# Patient Record
Sex: Female | Born: 1995 | Race: White | Hispanic: No | Marital: Single | Smoking: Current every day smoker
Health system: Southern US, Community
[De-identification: ages and names within clinical notes are randomized; demographics above are authoritative.]

## PROBLEM LIST (undated history)

## (undated) ENCOUNTER — Inpatient Hospital Stay (HOSPITAL_COMMUNITY): Payer: Self-pay

## (undated) DIAGNOSIS — Z8619 Personal history of other infectious and parasitic diseases: Secondary | ICD-10-CM

## (undated) DIAGNOSIS — Z789 Other specified health status: Secondary | ICD-10-CM

## (undated) HISTORY — PX: MOUTH SURGERY: SHX715

## (undated) HISTORY — DX: Personal history of other infectious and parasitic diseases: Z86.19

## (undated) HISTORY — PX: OTHER SURGICAL HISTORY: SHX169

---
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
## 2013-06-26 ENCOUNTER — Inpatient Hospital Stay (HOSPITAL_COMMUNITY)
Admission: AD | Admit: 2013-06-26 | Discharge: 2013-06-26 | Disposition: A | Discharge status: Home / Self Care | Payer: Medicaid Other | Source: Ambulatory Visit | Attending: Obstetrics & Gynecology | Admitting: Obstetrics & Gynecology

## 2013-06-26 ENCOUNTER — Encounter (HOSPITAL_COMMUNITY): Payer: Self-pay | Admitting: *Deleted

## 2013-06-26 DIAGNOSIS — F17200 Nicotine dependence, unspecified, uncomplicated: Secondary | ICD-10-CM | POA: Insufficient documentation

## 2013-06-26 DIAGNOSIS — R112 Nausea with vomiting, unspecified: Secondary | ICD-10-CM | POA: Insufficient documentation

## 2013-06-26 DIAGNOSIS — Z3202 Encounter for pregnancy test, result negative: Secondary | ICD-10-CM

## 2013-06-26 DIAGNOSIS — R109 Unspecified abdominal pain: Secondary | ICD-10-CM | POA: Insufficient documentation

## 2013-06-26 HISTORY — DX: Other specified health status: Z78.9

## 2013-06-26 LAB — URINALYSIS, ROUTINE W REFLEX MICROSCOPIC
Bilirubin Urine: NEGATIVE
GLUCOSE, UA: NEGATIVE mg/dL
Ketones, ur: NEGATIVE mg/dL
LEUKOCYTES UA: NEGATIVE
Nitrite: NEGATIVE
PROTEIN: NEGATIVE mg/dL
SPECIFIC GRAVITY, URINE: 1.02 (ref 1.005–1.030)
Urobilinogen, UA: 0.2 mg/dL (ref 0.0–1.0)
pH: 6 (ref 5.0–8.0)

## 2013-06-26 LAB — URINE MICROSCOPIC-ADD ON

## 2013-06-26 LAB — HCG, QUANTITATIVE, PREGNANCY

## 2013-06-26 LAB — POCT PREGNANCY, URINE: PREG TEST UR: NEGATIVE

## 2013-06-26 NOTE — MAU Provider Note (Signed)
History     CSN: [id number]  Arrival date and time: 06/26/13 1857   None     Chief Complaint  Patient presents with  . Abdominal Pain  . Possible Pregnancy   HPI Rose Gibbs is 18 y.o. G1P0 presents for pregnancy test.  Her menstrual cycle is late and she has had some nausea. LMP 05/20/13.  She is sexually active, not using contraception.  She does not want pregnancy at this time.  She reported white discharge in Triage.  States it is normal and there is no odor or abnormal color.  She does report nausea and vomiting several times last night and once today.     Past Medical History  Diagnosis Date  . Medical history non-contributory     Past Surgical History  Procedure Laterality Date  . Mouth surgery      No family history on file.  History  Substance Use Topics  . Smoking status: Current Every Day Smoker -- 0.50 packs/day    Types: Cigarettes  . Smokeless tobacco: Not on file  . Alcohol Use: No    Allergies: No Known Allergies  Prescriptions prior to admission  Medication Sig Dispense Refill  . acyclovir (ZOVIRAX) 400 MG tablet Take 1 tablet (400 mg total) by mouth every 4 (four) hours while awake.  35 tablet  0  . amoxicillin-clavulanate (AUGMENTIN) 875-125 MG per tablet Take 1 tablet by mouth 2 (two) times daily.  20 tablet  0  . mupirocin ointment (BACTROBAN) 2 % Apply topically 3 (three) times daily.  22 g  0  . traMADol (ULTRAM) 50 MG tablet Take 2 tablets (100 mg total) by mouth every 8 (eight) hours as needed for pain.  30 tablet  0    Review of Systems  Gastrointestinal: Positive for nausea and vomiting. Negative for abdominal pain.  Genitourinary:       Neg for vaginal bleeding or abnormal discharge   Physical Exam   Blood pressure 132/80, pulse 98, resp. rate 18, height 5\' 3"  (1.6 m), weight 111 lb 3.2 oz (50.44 kg), last menstrual period 05/20/2013.  Physical Exam  Constitutional: She is oriented to person, place, and time. She appears  well-developed and well-nourished. No distress.  HENT:  Head: Normocephalic.  Cardiovascular: Normal rate.   Respiratory: Effort normal.  Genitourinary:  Pelvic exam declined by patient  Neurological: She is alert and oriented to person, place, and time.  Skin: Skin is warm and dry. No erythema.  Psychiatric: She has a normal mood and affect. Her behavior is normal.    MAU Course  Procedures  MDM Care turned over to MizeJulie, [place] at 20:00. KEY,EVE M 06/26/2013, 7:49 PM   2000 - Labs pending. Care assumed from Jeani SowEve Key, NP  Results for orders placed during the hospital encounter of 06/26/13 (from the past 24 hour(s))  URINALYSIS, ROUTINE W REFLEX MICROSCOPIC     Status: Abnormal   Collection Time    06/26/13  7:05 PM      Result Value Ref Range   Color, Urine YELLOW  YELLOW   APPearance CLEAR  CLEAR   Specific Gravity, Urine 1.020  1.005 - 1.030   pH 6.0  5.0 - 8.0   Glucose, UA NEGATIVE  NEGATIVE mg/dL   Hgb urine dipstick MODERATE (*) NEGATIVE   Bilirubin Urine NEGATIVE  NEGATIVE   Ketones, ur NEGATIVE  NEGATIVE mg/dL   Protein, ur NEGATIVE  NEGATIVE mg/dL   Urobilinogen, UA 0.2  0.0 - 1.0 mg/dL  Nitrite NEGATIVE  NEGATIVE   Leukocytes, UA NEGATIVE  NEGATIVE  URINE MICROSCOPIC-ADD ON     Status: Abnormal   Collection Time    06/26/13  7:05 PM      Result Value Ref Range   Squamous Epithelial / LPF FEW (*) RARE   WBC, UA 0-2  <3 WBC/hpf   RBC / HPF 0-2  <3 RBC/hpf   Bacteria, UA RARE  RARE  POCT PREGNANCY, URINE     Status: None   Collection Time    06/26/13  7:16 PM      Result Value Ref Range   Preg Test, Ur NEGATIVE  NEGATIVE  HCG, QUANTITATIVE, PREGNANCY     Status: None   Collection Time    06/26/13  7:58 PM      Result Value Ref Range   hCG, Beta Chain, Quant, S <1  <5 mIU/mL    Assessment and Plan  A: Negative pregnancy test  P: Discharge home Patient advised to take HPT in 1-2 weeks if still no period and follow-up accordingly Patient may return to  MAU as needed or if her condition were to change or worsen  Freddi StarrJulie N Ethier, PA-C  06/26/2013 8:59 PM

## 2013-06-26 NOTE — Discharge Instructions (Signed)
Pregnancy Tests HOW DO PREGNANCY TESTS WORK? All pregnancy tests look for a special hormone in the urine or blood that is only present in pregnant women. This hormone, human chorionic gonadotropin (hCG), is also called the pregnancy hormone.  WHAT IS THE DIFFERENCE BETWEEN A URINE AND A BLOOD PREGNANCY TEST? IS ONE BETTER THAN THE OTHER? There are two types of pregnancy tests.  Blood tests.  Urine tests. Both tests look for the presence of hCG, the pregnancy hormone. Many women use a urine test or home pregnancy test (HPT) to find out if they are pregnant. HPTs are cheap, easy to use, can be done at home, and are private. When a woman has a positive result on an HPT, she needs to see her caregiver right away. The caregiver can confirm a positive HPT result with another urine test, a blood test, ultrasound, and a pelvic exam.  There are two types of blood tests you can get from a caregiver.   A quantitative blood test (or the beta hCG test). This test measures the exact amount of hCG in the blood. This means it can pick up very small amounts of hCG, making it a very accurate test.  A qualitative hCG blood test. This test gives a simple yes or no answer to whether you are pregnant. This test is more like a urine test in terms of its accuracy. Blood tests can pick up hCG earlier in a pregnancy than urine tests can. Blood tests can tell if you are pregnant about 6 to 8 days after you release an egg from an ovary (ovulate). Urine tests can determine pregnancy about 2 weeks after ovulation.  HOW IS A HOME PREGNANCY TEST DONE?  There are many types of home pregnancy tests or HPTs that can be bought over-the-counter at drug or discount stores.   Some involve collecting your urine in a cup and dipping a stick into the urine or putting some of the urine into a special container with an eyedropper.  Others are done by placing a stick into your urine stream.  Tests vary in how long you need to wait for  the stick or container to turn a certain color or have a symbol on it (like a plus or a minus).  All tests come with written instructions. Most tests also have toll-free phone numbers to call if you have any questions about how to do the test or read the results. HOW ACCURATE ARE HOME PREGNANCY TESTS?  HPTs are very accurate. Most brands of HPTs say they are 97% to 99% accurate when taken 1 week after missing your menstrual period, but this can vary with actual use. Each brand varies in how sensitive it is in picking up the pregnancy hormone hCG. If a test is not done correctly, it will be less accurate. Always check the package to make sure it is not past its expiration date. If it is, it will not be accurate. Most brands of HPTs tell users to do the test again in a few days, no matter what the results.  If you use an HPT too early in your pregnancy, you may not have enough of the pregnancy hormone hCG in your urine to have a positive test result. Most HPTs will be accurate if you test yourself around the time your period is due (about 2 weeks after you ovulate). You can get a negative test result if you are not pregnant or if you ovulated later than you thought you did.  You may also have problems with the pregnancy, which affects the amount of hCG you have in your urine. If your HPT is negative, test yourself again within a few days to 1 week. If you keep getting a negative result and think you are pregnant, talk with your caregiver right away about getting a blood pregnancy test.  FALSE POSITIVE PREGNANCY TEST A false positive HPT can happen if there is blood or protein present in your urine. A false positive can also happen if you were recently pregnant or if you take a pregnancy test too soon after taking fertility drug that contains hCG. Also, some prescription medicines such as water pills (diuretics), tranquilizers, seizure medicines, psychiatric medicines, and allergy and nausea medicines  (promethazine) give false positive readings. FALSE NEGATIVE PREGNANCY TEST  A false negative HPT can happen if you do the test too early. Try to wait until you are at least 1 day late for your menstrual period.  It may happen if you wait too long to test the urine (longer than 15 minutes).  It may also happen if the urine is too diluted because you drank a lot of fluids before getting the urine sample. It is best to test the first morning urine after you get out of bed. If your menstrual period did not start after a week of a negative HPT, repeat the pregnancy test. CAN ANYTHING INTERFERE WITH HOME PREGNANCY TEST RESULTS?  Most medicines, both over-the-counter and prescription drugs, including birth control pills and antibiotics, should not affect the results of a HPT. Only those drugs that have the pregnancy hormone hCG in them can give a false positive test result. Drugs that have hCG in them may be used for treating infertility (not being able to get pregnant). Alcohol and illegal drugs do not affect HPT results, but you should not be using these substances if you are trying to get pregnant. If you have a positive pregnancy test, call your caregiver to make an appointment to begin prenatal care. Document Released: 12/28/2002 Document Revised: 03/19/2011 Document Reviewed: 03/10/2010 [facility] Patient Information 2015 [place], [place]. This information is not intended to replace advice given to you by your health care provider. Make sure you discuss any questions you have with your health care provider.

## 2013-06-26 NOTE — MAU Note (Signed)
My period is late and I've thrown up some. Having some pain in lower abd that goes from side to side. Having some white d/c

## 2013-11-09 ENCOUNTER — Encounter (HOSPITAL_COMMUNITY): Payer: Self-pay | Admitting: *Deleted

## 2014-01-08 NOTE — L&D Delivery Note (Signed)
Patient was C/C/+3 and pushed for 10 minutes with epidural.   NSVD  female infant, Apgars 8,9, weight P.   The patient had one first degree perineal and one R first degree labial laceration repaired with 2-0 and 3-0 vicryl R respectively. Fundus was firm. EBL was expected amount- my estimate about 200cc. Placenta was delivered intact. Vagina was clear.  Baby was vigorous and doing skin to skin with mother.  Mita Vallo A

## 2014-02-19 ENCOUNTER — Emergency Department (INDEPENDENT_AMBULATORY_CARE_PROVIDER_SITE_OTHER)
Admission: EM | Admit: 2014-02-19 | Discharge: 2014-02-19 | Disposition: A | Discharge status: Home / Self Care | Payer: Self-pay | Source: Home / Self Care | Attending: Emergency Medicine | Admitting: Emergency Medicine

## 2014-02-19 ENCOUNTER — Encounter (HOSPITAL_COMMUNITY): Payer: Self-pay | Admitting: Emergency Medicine

## 2014-02-19 DIAGNOSIS — Z3201 Encounter for pregnancy test, result positive: Secondary | ICD-10-CM

## 2014-02-19 LAB — POCT URINALYSIS DIP (DEVICE)
Glucose, UA: NEGATIVE mg/dL
KETONES UR: NEGATIVE mg/dL
LEUKOCYTES UA: NEGATIVE
Nitrite: NEGATIVE
Protein, ur: NEGATIVE mg/dL
Urobilinogen, UA: 0.2 mg/dL (ref 0.0–1.0)
pH: 6 (ref 5.0–8.0)

## 2014-02-19 LAB — POCT PREGNANCY, URINE: Preg Test, Ur: POSITIVE — AB

## 2014-02-19 MED ORDER — PRENATAL VITAMIN 27-0.8 MG PO TABS: 1.0000 | ORAL_TABLET | Freq: Every day | ORAL | Status: DC

## 2014-02-19 NOTE — ED Provider Notes (Signed)
CSN: [id number]     Arrival date & time 02/19/14  1140 History   First MD Initiated Contact with Patient 02/19/14 1205     Chief Complaint  Patient presents with  . Possible Pregnancy   (Consider location/radiation/quality/duration/timing/severity/associated sxs/prior Treatment) HPI Comments: 19 year old female has had 2 positive home pregnancy test. Her LMP was 12/29/2013. She is experiencing breast tenderness and a.m. nausea with occasional vomiting. She denies pelvic pain, abdominal pain, vaginal discharge or bleeding.   Past Medical History  Diagnosis Date  . Medical history non-contributory    Past Surgical History  Procedure Laterality Date  . Mouth surgery     History reviewed. No pertinent family history. History  Substance Use Topics  . Smoking status: Current Every Day Smoker -- 0.50 packs/day    Types: Cigarettes  . Smokeless tobacco: Not on file  . Alcohol Use: No   OB History    Gravida Para Term Preterm AB TAB SAB Ectopic Multiple Living   1              Review of Systems  Constitutional: Negative.   Cardiovascular: Negative for chest pain.  Gastrointestinal: Positive for nausea. Negative for abdominal pain and abdominal distention.  Genitourinary: Positive for frequency. Negative for dysuria, urgency, vaginal bleeding, vaginal discharge and pelvic pain.    Allergies  Amoxicillin; Penicillins; and Latex  Home Medications   Prior to Admission medications   Medication Sig Start Date End Date Taking? Authorizing Provider  acetaminophen (TYLENOL) 325 MG tablet Take 650 mg by mouth every 6 (six) hours as needed for headache.    Historical Provider, MD  Naphazoline-Glycerin (CLEAR EYES MAX REDNESS RELIEF) 0.03-0.5 % SOLN Place 1 drop into both eyes daily as needed (For redness.).    Historical Provider, MD  Prenatal Vit-Fe Fumarate-FA (PRENATAL VITAMIN) 27-0.8 MG TABS Take 1 tablet by mouth daily. 02/19/14   Hayden Rasmussenavid Monae Topping, NP   BP 114/70 mmHg  Pulse 80   Temp(Src) 97.9 F (36.6 C) (Oral)  Resp 18  SpO2 97%  LMP 12/29/2013 (Exact Date) Physical Exam  Constitutional: She is oriented to person, place, and time. She appears well-developed and well-nourished. No distress.  Neck: Normal range of motion. Neck supple.  Pulmonary/Chest: Effort normal. No respiratory distress.  Abdominal: Soft. She exhibits no distension and no mass. There is no tenderness. There is no rebound and no guarding.  No tenderness across the anterior pelvis  Neurological: She is alert and oriented to person, place, and time. She exhibits normal muscle tone.  Skin: Skin is warm and dry.  Psychiatric: She has a normal mood and affect.  Nursing note and vitals reviewed.   ED Course  Procedures (including critical care time) Labs Review Labs Reviewed  POCT PREGNANCY, URINE - Abnormal; Notable for the following:    Preg Test, Ur POSITIVE (*)    All other components within normal limits  POCT URINALYSIS DIP (DEVICE) - Abnormal; Notable for the following:    Bilirubin Urine SMALL (*)    Hgb urine dipstick MODERATE (*)    All other components within normal limits    Imaging Review No results found.   MDM   1. Positive urine pregnancy test    Rx for prenatal vitamins written Note stating patient's test for pregnancy is positive Follow-up with PCP and eventually OB/GYN. There is moderate amount of blood in the urine. If he develops urine symptoms such as pain with urination, increased frequency, fever, pelvic pain or vaginal bleeding [facility].    Hayden Rasmussen, NP 02/19/14 1226

## 2014-02-19 NOTE — ED Notes (Signed)
Pt is here for a pregnancy test.

## 2014-02-19 NOTE — Discharge Instructions (Signed)
Pregnancy Tests HOW DO PREGNANCY TESTS WORK? All pregnancy tests look for a special hormone in the urine or blood that is only present in pregnant women. This hormone, human chorionic gonadotropin (hCG), is also called the pregnancy hormone.  WHAT IS THE DIFFERENCE BETWEEN A URINE AND A BLOOD PREGNANCY TEST? IS ONE BETTER THAN THE OTHER? There are two types of pregnancy tests.  Blood tests.  Urine tests. Both tests look for the presence of hCG, the pregnancy hormone. Many women use a urine test or home pregnancy test (HPT) to find out if they are pregnant. HPTs are cheap, easy to use, can be done at home, and are private. When a woman has a positive result on an HPT, she needs to see her caregiver right away. The caregiver can confirm a positive HPT result with another urine test, a blood test, ultrasound, and a pelvic exam.  There are two types of blood tests you can get from a caregiver.   A quantitative blood test (or the beta hCG test). This test measures the exact amount of hCG in the blood. This means it can pick up very small amounts of hCG, making it a very accurate test.  A qualitative hCG blood test. This test gives a simple yes or no answer to whether you are pregnant. This test is more like a urine test in terms of its accuracy. Blood tests can pick up hCG earlier in a pregnancy than urine tests can. Blood tests can tell if you are pregnant about 6 to 8 days after you release an egg from an ovary (ovulate). Urine tests can determine pregnancy about 2 weeks after ovulation.  HOW IS A HOME PREGNANCY TEST DONE?  There are many types of home pregnancy tests or HPTs that can be bought over-the-counter at drug or discount stores.   Some involve collecting your urine in a cup and dipping a stick into the urine or putting some of the urine into a special container with an eyedropper.  Others are done by placing a stick into your urine stream.  Tests vary in how long you need to wait for  the stick or container to turn a certain color or have a symbol on it (like a plus or a minus).  All tests come with written instructions. Most tests also have toll-free phone numbers to call if you have any questions about how to do the test or read the results. HOW ACCURATE ARE HOME PREGNANCY TESTS?  HPTs are very accurate. Most brands of HPTs say they are 97% to 99% accurate when taken 1 week after missing your menstrual period, but this can vary with actual use. Each brand varies in how sensitive it is in picking up the pregnancy hormone hCG. If a test is not done correctly, it will be less accurate. Always check the package to make sure it is not past its expiration date. If it is, it will not be accurate. Most brands of HPTs tell users to do the test again in a few days, no matter what the results.  If you use an HPT too early in your pregnancy, you may not have enough of the pregnancy hormone hCG in your urine to have a positive test result. Most HPTs will be accurate if you test yourself around the time your period is due (about 2 weeks after you ovulate). You can get a negative test result if you are not pregnant or if you ovulated later than you thought you did.  You may also have problems with the pregnancy, which affects the amount of hCG you have in your urine. If your HPT is negative, test yourself again within a few days to 1 week. If you keep getting a negative result and think you are pregnant, talk with your caregiver right away about getting a blood pregnancy test.  FALSE POSITIVE PREGNANCY TEST A false positive HPT can happen if there is blood or protein present in your urine. A false positive can also happen if you were recently pregnant or if you take a pregnancy test too soon after taking fertility drug that contains hCG. Also, some prescription medicines such as water pills (diuretics), tranquilizers, seizure medicines, psychiatric medicines, and allergy and nausea medicines  (promethazine) give false positive readings. FALSE NEGATIVE PREGNANCY TEST  A false negative HPT can happen if you do the test too early. Try to wait until you are at least 1 day late for your menstrual period.  It may happen if you wait too long to test the urine (longer than 15 minutes).  It may also happen if the urine is too diluted because you drank a lot of fluids before getting the urine sample. It is best to test the first morning urine after you get out of bed. If your menstrual period did not start after a week of a negative HPT, repeat the pregnancy test. CAN ANYTHING INTERFERE WITH HOME PREGNANCY TEST RESULTS?  Most medicines, both over-the-counter and prescription drugs, including birth control pills and antibiotics, should not affect the results of a HPT. Only those drugs that have the pregnancy hormone hCG in them can give a false positive test result. Drugs that have hCG in them may be used for treating infertility (not being able to get pregnant). Alcohol and illegal drugs do not affect HPT results, but you should not be using these substances if you are trying to get pregnant. If you have a positive pregnancy test, call your caregiver to make an appointment to begin prenatal care. Document Released: 12/28/2002 Document Revised: 03/19/2011 Document Reviewed: 04/10/2013 [facility]Exit[facility] Patient Information 2015 [place], [place]. This information is not intended to replace advice given to you by your health care provider. Make sure you discuss any questions you have with your health care provider.  Prenatal Care  WHAT IS PRENATAL CARE?  Prenatal care means health care during your pregnancy, before your baby is born. It is very important to take care of yourself and your baby during your pregnancy by:   Getting early prenatal care. If you know you are pregnant, or think you might be pregnant, call your health care provider as soon as possible. Schedule a visit for a prenatal exam.  Getting  regular prenatal care. Follow your health care provider's schedule for blood and other necessary tests. Do not miss appointments.  Doing everything you can to keep yourself and your baby healthy during your pregnancy.  Getting complete care. Prenatal care should include evaluation of the medical, dietary, educational, psychological, and social needs of you and your significant other. The medical and genetic history of your family and the family of your baby's father should be discussed with your health care provider.  Discussing with your health care provider:  Prescription, over-the-counter, and herbal medicines that you take.  Any history of substance abuse, alcohol use, smoking, and illegal drug use.  Any history of domestic abuse and violence.  Immunizations you have received.  Your nutrition and diet.  The amount of exercise you do.  Any environmental and occupational hazards to which you are exposed.  History of sexually transmitted infections for both you and your partner.  Previous pregnancies you have had. WHY IS PRENATAL CARE SO IMPORTANT?  By regularly seeing your health care provider, you help ensure that problems can be identified early so that they can be treated as soon as possible. Other problems might be prevented. Many studies have shown that early and regular prenatal care is important for the health of mothers and their babies.  HOW CAN I TAKE CARE OF MYSELF WHILE I AM PREGNANT?  Here are ways to take care of yourself and your baby:   Start or continue taking your multivitamin with 400 micrograms (mcg) of folic acid every day.  Get early and regular prenatal care. It is very important to see a health care provider during your pregnancy. Your health care provider will check at each visit to make sure that you and your baby are healthy. If there are any problems, action can be taken right away to help you and your baby.  Eat a healthy diet that  includes:  Fruits.  Vegetables.  Foods low in saturated fat.  Whole grains.  Calcium-rich foods, such as milk, yogurt, and hard cheeses.  Drink 6-8 glasses of liquids a day.  Unless your health care provider tells you not to, try to be physically active for 30 minutes, most days of the week. If you are pressed for time, you can get your activity in through 10-minute segments, three times a day.  Do not smoke, drink alcohol, or use drugs. These can cause long-term damage to your baby. Talk with your health care provider about steps to take to stop smoking. Talk with a member of your faith community, a counselor, a trusted friend, or your health care provider if you are concerned about your alcohol or drug use.  Ask your health care provider before taking any medicine, even over-the-counter medicines. Some medicines are not safe to take during pregnancy.  Get plenty of rest and sleep.  Avoid hot tubs and saunas during pregnancy.  Do not have X-rays taken unless absolutely necessary and with the recommendation of your health care provider. A lead shield can be placed on your abdomen to protect your baby when X-rays are taken in other parts of your body.  Do not empty the cat litter when you are pregnant. It may contain a parasite that causes an infection called toxoplasmosis, which can cause birth defects. Also, use gloves when working in garden areas used by cats.  Do not eat uncooked or undercooked meats or fish.  Do not eat soft, mold-ripened cheeses (Brie, Camembert, and chevre) or soft, blue-veined cheese (Danish blue and Roquefort).  Stay away from toxic chemicals like:  Insecticides.  Solvents (some cleaners or paint thinners).  Lead.  Mercury.  Sexual intercourse may continue until the end of the pregnancy, unless you have a medical problem or there is a problem with the pregnancy and your health care provider tells you not to.  Do not wear high-heel shoes, especially  during the second half of the pregnancy. You can lose your balance and fall.  Do not take long trips, unless absolutely necessary. Be sure to see your health care provider before going on the trip.  Do not sit in one position for more than 2 hours when on a trip.  Take a copy of your medical records when going on a trip. Know where a hospital is located  in the city you are visiting, in case of an emergency.  Most dangerous household products will have pregnancy warnings on their labels. Ask your health care provider about products if you are unsure.  Limit or eliminate your caffeine intake from coffee, tea, sodas, medicines, and chocolate.  Many women continue working through pregnancy. Staying active might help you stay healthier. If you have a question about the safety or the hours you work at your particular job, talk with your health care provider.  Get informed:  Read books.  Watch videos.  Go to childbirth classes for you and your significant other.  Talk with experienced moms.  Ask your health care provider about childbirth education classes for you and your partner. Classes can help you and your partner prepare for the birth of your baby.  Ask about a baby doctor (pediatrician) and methods and pain medicine for labor, delivery, and possible cesarean delivery. HOW OFTEN SHOULD I SEE MY HEALTH CARE PROVIDER DURING PREGNANCY?  Your health care provider will give you a schedule for your prenatal visits. You will have visits more often as you get closer to the end of your pregnancy. An average pregnancy lasts about 40 weeks.  A typical schedule includes visiting your health care provider:   About once each month during your first 6 months of pregnancy.  Every 2 weeks during the next 2 months.  Weekly in the last month, until the delivery date. Your health care provider will probably want to see you more often if:  You are older than 35 years.  Your pregnancy is high risk  because you have certain health problems or problems with the pregnancy, such as:  Diabetes.  High blood pressure.  The baby is not growing on schedule, according to the dates of the pregnancy. Your health care provider will do special tests to make sure you and your baby are not having any serious problems. WHAT HAPPENS DURING PRENATAL VISITS?   At your first prenatal visit, your health care provider will do a physical exam and talk to you about your health history and the health history of your partner and your family. Your health care provider will be able to tell you what date to expect your baby to be born on.  Your first physical exam will include checks of your blood pressure, measurements of your height and weight, and an exam of your pelvic organs. Your health care provider will do a Pap test if you have not had one recently and will do cultures of your cervix to make sure there is no infection.  At each prenatal visit, there will be tests of your blood, urine, blood pressure, weight, and the progress of the baby will be checked.  At your later prenatal visits, your health care provider will check how you are doing and how your baby is developing. You may have a number of tests done as your pregnancy progresses.  Ultrasound exams are often used to check on your baby's growth and health.  You may have more urine and blood tests, as well as special tests, if needed. These may include amniocentesis to examine fluid in the pregnancy sac, stress tests to check how the baby responds to contractions, or a biophysical profile to measure your baby's well-being. Your health care provider will explain the tests and why they are necessary.  You should be tested for high blood sugar (gestational diabetes) between the 24th and 28th weeks of your pregnancy.  You should discuss  with your health care provider your plans to breastfeed or bottle-feed your baby.  Each visit is also a chance for you to  learn about staying healthy during pregnancy and to ask questions. Document Released: 12/28/2002 Document Revised: 12/30/2012 Document Reviewed: 03/11/2013 [facility] Patient Information 2015 [place], [place]. This information is not intended to replace advice given to you by your health care provider. Make sure you discuss any questions you have with your health care provider.

## 2014-04-30 LAB — OB RESULTS CONSOLE HIV ANTIBODY (ROUTINE TESTING): HIV: NONREACTIVE

## 2014-04-30 LAB — OB RESULTS CONSOLE GC/CHLAMYDIA
Chlamydia: NEGATIVE
Gonorrhea: NEGATIVE

## 2014-04-30 LAB — OB RESULTS CONSOLE ABO/RH: RH TYPE: POSITIVE

## 2014-04-30 LAB — OB RESULTS CONSOLE RPR: RPR: NONREACTIVE

## 2014-04-30 LAB — OB RESULTS CONSOLE RUBELLA ANTIBODY, IGM: Rubella: IMMUNE

## 2014-04-30 LAB — OB RESULTS CONSOLE ANTIBODY SCREEN: Antibody Screen: NEGATIVE

## 2014-04-30 LAB — OB RESULTS CONSOLE HEPATITIS B SURFACE ANTIGEN: HEP B S AG: NEGATIVE

## 2014-08-20 ENCOUNTER — Encounter (HOSPITAL_COMMUNITY): Payer: Self-pay | Admitting: *Deleted

## 2014-08-20 ENCOUNTER — Emergency Department (HOSPITAL_COMMUNITY)
Admission: EM | Admit: 2014-08-20 | Discharge: 2014-08-21 | Disposition: A | Discharge status: Home / Self Care | Payer: Medicaid Other | Attending: Emergency Medicine | Admitting: Emergency Medicine

## 2014-08-20 DIAGNOSIS — Z79899 Other long term (current) drug therapy: Secondary | ICD-10-CM | POA: Insufficient documentation

## 2014-08-20 DIAGNOSIS — Z3493 Encounter for supervision of normal pregnancy, unspecified, third trimester: Secondary | ICD-10-CM

## 2014-08-20 DIAGNOSIS — Y9289 Other specified places as the place of occurrence of the external cause: Secondary | ICD-10-CM | POA: Diagnosis not present

## 2014-08-20 DIAGNOSIS — W1839XA Other fall on same level, initial encounter: Secondary | ICD-10-CM | POA: Insufficient documentation

## 2014-08-20 DIAGNOSIS — S3991XA Unspecified injury of abdomen, initial encounter: Secondary | ICD-10-CM | POA: Insufficient documentation

## 2014-08-20 DIAGNOSIS — Z3A33 33 weeks gestation of pregnancy: Secondary | ICD-10-CM | POA: Diagnosis not present

## 2014-08-20 DIAGNOSIS — Y9389 Activity, other specified: Secondary | ICD-10-CM | POA: Diagnosis not present

## 2014-08-20 DIAGNOSIS — S3992XA Unspecified injury of lower back, initial encounter: Secondary | ICD-10-CM | POA: Insufficient documentation

## 2014-08-20 DIAGNOSIS — Y998 Other external cause status: Secondary | ICD-10-CM | POA: Diagnosis not present

## 2014-08-20 DIAGNOSIS — W19XXXA Unspecified fall, initial encounter: Secondary | ICD-10-CM

## 2014-08-20 DIAGNOSIS — O9A219 Injury, poisoning and certain other consequences of external causes complicating pregnancy, unspecified trimester: Secondary | ICD-10-CM | POA: Insufficient documentation

## 2014-08-20 MED ORDER — SODIUM CHLORIDE 0.9 % IV SOLN
1000.0000 mL | Freq: Once | INTRAVENOUS | Status: AC
  Administered 2014-08-20: 1000 mL via INTRAVENOUS

## 2014-08-20 MED ORDER — NIFEDIPINE 10 MG PO CAPS
10.0000 mg | ORAL_CAPSULE | Freq: Once | ORAL | Status: AC
  Administered 2014-08-20: 10 mg via ORAL
  Filled 2014-08-20: qty 1

## 2014-08-20 NOTE — Progress Notes (Signed)
No irritability over last 30 minutes. Patients states tail bone is sore from fall but otherwise feels ok. Monitoring d/c'd. Pt to discharge from ED.

## 2014-08-20 NOTE — ED Provider Notes (Signed)
CSN: [id number]     Arrival date & time 08/20/14  2116 History   First MD Initiated Contact with Patient 08/20/14 2126     Chief Complaint  Patient presents with  . Abdominal Pain    [redacted] weeks pregnant     (Consider location/radiation/quality/duration/timing/severity/associated sxs/prior Treatment) The history is provided by the patient and the EMS personnel. No language interpreter was used.  Patient is a 19 year old female who is G1 P0 with past medical history of oral cyst removal who presents who presents today as a level II trauma status post fall in the shower. Patient relations washing her hair in her parent's bathroom when she stepped back and the reset upon the at the floor and she slipped with her peak left underneath her landing on her bottom. Patient is notably [redacted] weeks gestational age. She denies any complications up to this point. Currently she endorses having pain in her sacral region and also is having significant abdominal pain on the top portion of her fundus. She relates she has not had any vaginal bleeding, gushes of fluid, contractions, and is still feeling the baby move. She denies any loss of consciousness with this. She denies any numbness or tingling in any of her extremities. Patient obstetrical care is here in [place]. Patient denies any other worsening or alleviating factors. She was able to ambulate after the accident.  History reviewed. No pertinent past medical history. Past Surgical History  Procedure Laterality Date  . Oral cyst removal     History reviewed. No pertinent family history. Social History  Substance Use Topics  . Smoking status: Never Smoker   . Smokeless tobacco: Never Used  . Alcohol Use: No   OB History    Gravida Para Term Preterm AB TAB SAB Ectopic Multiple Living   1              Review of Systems  HENT: Negative for facial swelling and nosebleeds.   Respiratory: Negative for chest tightness and shortness of breath.    Cardiovascular: Negative for chest pain and leg swelling.  Gastrointestinal: Positive for abdominal pain (top of abdomen). Negative for nausea and vomiting.  Genitourinary: Negative for vaginal bleeding and vaginal pain.  Musculoskeletal: Positive for back pain (very low back, near buttock (tailbone)). Negative for myalgias, joint swelling, arthralgias, gait problem, neck pain and neck stiffness.  Skin: Negative for rash and wound.  Neurological: Negative for weakness, light-headedness, numbness and headaches.  All other systems reviewed and are negative.     Allergies  Review of patient's allergies indicates no known allergies.  Home Medications   Prior to Admission medications   Medication Sig Start Date End Date Taking? Authorizing Provider  Prenatal Vit-Fe Fumarate-FA (PRENATAL MULTIVITAMIN) TABS tablet Take 1 tablet by mouth daily at 12 noon.   Yes Historical Provider, MD   BP 117/76 mmHg  Pulse 75  Temp(Src) 98.3 F (36.8 C) (Oral)  Resp 12  Ht 5\' 2"  (1.575 m)  Wt 140 lb (63.504 kg)  BMI 25.60 kg/m2  SpO2 97%  LMP 12/29/2013 Physical Exam  Constitutional: She is oriented to person, place, and time. She appears well-developed and well-nourished. No distress.  HENT:  Head: Normocephalic and atraumatic.  Eyes: Conjunctivae and EOM are normal. Pupils are equal, round, and reactive to light.  Neck: Normal range of motion. Neck supple.  No midline c spine ttp  Cardiovascular: Normal rate and regular rhythm.   Pulmonary/Chest: Effort normal and breath sounds normal. No respiratory distress. She  has no wheezes.  Abdominal: There is tenderness. There is no guarding.  Gravid abdomen, mild ttp near top of uterine fundus  Musculoskeletal: Normal range of motion. She exhibits tenderness (mild sacral tenderness to palpation, no obvious deformity).  Neurological: She is alert and oriented to person, place, and time. No cranial nerve deficit. Gait normal.  5/5 strength throughout   Skin: Skin is warm and dry. She is not diaphoretic.  Psychiatric: She has a normal mood and affect. Her behavior is normal.  Nursing note and vitals reviewed.   ED Course  Procedures (including critical care time) Labs Review Labs Reviewed - No data to display  Imaging Review No results found. I, Madolyn Frieze, personally reviewed and evaluated these images and lab results as part of my medical decision-making.   EKG Interpretation None      MDM   Final diagnoses:  Third trimester pregnancy  Fall, initial encounter    Patient is a 19 year old female who is G1 P0 with past medical history of oral cyst removal who presents who presents today as a level II trauma status post fall in the shower. OB nurse consulted, recommends observation on tocometer. Pt with intact airway, GCS 15, only reporting pain in lower back and buttock. No focal neuro deficits. No LOC and no n/v or confusion since. Vital signs have been stable. Toco shows mild uterine irritability which improved after getting fluids and dose of procardia (per OB team recommendations). Following several hour observation on toco and noted reassuring FHT pt was discharged. No obvious injury to pt on exam. Pt to follow up with her OBGYN as planned.     Madolyn Frieze, MD 08/21/14 1531  Nelva Nay, MD 08/22/14 1537

## 2014-08-20 NOTE — Progress Notes (Signed)
Rose Gibbs is a G1P0 @ [id number]. Pt had a fall in shower at about 2030 today, arrived via EMS. FHT 140bpm, reactive, positive accelerations, no decelerations. Pt has had occasional contractraction (lasting 60 sec or more) with rolling uterine irritability. Dr. Henderson Cloud notified of above information. Pt to have Procardia, , PO x1 and monitor for one hour after for improvement in uterine irritability.

## 2014-08-20 NOTE — Discharge Instructions (Signed)
Continue to follow up with her OB/GYN physician as planned. Please immediately call your doctor or come to the emergency department if you experience sudden vaginal bleeding or significant abdominal pain.

## 2014-08-23 ENCOUNTER — Encounter (HOSPITAL_COMMUNITY): Payer: Self-pay | Admitting: Emergency Medicine

## 2014-08-23 ENCOUNTER — Encounter (HOSPITAL_COMMUNITY): Payer: Self-pay

## 2014-08-31 ENCOUNTER — Encounter (HOSPITAL_COMMUNITY): Payer: Self-pay | Admitting: *Deleted

## 2014-08-31 ENCOUNTER — Inpatient Hospital Stay (HOSPITAL_COMMUNITY)
Admission: AD | Admit: 2014-08-31 | Discharge: 2014-08-31 | Disposition: A | Discharge status: Home / Self Care | Payer: Medicaid Other | Source: Ambulatory Visit | Attending: Obstetrics and Gynecology | Admitting: Obstetrics and Gynecology

## 2014-08-31 DIAGNOSIS — Z3A34 34 weeks gestation of pregnancy: Secondary | ICD-10-CM | POA: Insufficient documentation

## 2014-08-31 DIAGNOSIS — O219 Vomiting of pregnancy, unspecified: Secondary | ICD-10-CM

## 2014-08-31 LAB — URINE MICROSCOPIC-ADD ON

## 2014-08-31 LAB — URINALYSIS, ROUTINE W REFLEX MICROSCOPIC
BILIRUBIN URINE: NEGATIVE
Glucose, UA: NEGATIVE mg/dL
KETONES UR: 15 mg/dL — AB
NITRITE: NEGATIVE
PH: 6 (ref 5.0–8.0)
Protein, ur: NEGATIVE mg/dL
SPECIFIC GRAVITY, URINE: 1.025 (ref 1.005–1.030)
Urobilinogen, UA: 1 mg/dL (ref 0.0–1.0)

## 2014-08-31 LAB — AMNISURE RUPTURE OF MEMBRANE (ROM) NOT AT ARMC: AMNISURE: NEGATIVE

## 2014-08-31 MED ORDER — PROMETHAZINE HCL 25 MG PO TABS
25.0000 mg | ORAL_TABLET | Freq: Once | ORAL | Status: AC
  Administered 2014-08-31: 25 mg via ORAL
  Filled 2014-08-31: qty 1

## 2014-08-31 MED ORDER — PROMETHAZINE HCL 25 MG PO TABS: 12.5000 mg | ORAL_TABLET | Freq: Four times a day (QID) | ORAL | Status: DC | PRN

## 2014-08-31 NOTE — MAU Provider Note (Signed)
History     CSN: [id number]  Arrival date and time: 08/31/14 6578   First Provider Initiated Contact with Patient 08/31/14 [phone]      No chief complaint on file.  HPI Comments: Rose Gibbs is a 19 y.o. G2P0010 at [redacted]w[redacted]d who presents today with abdominal pain, nausea/vomiting and dark colored urine. She is also concerned that she is leaking fluid. She denies any vaginal bleeding. She states that the fetus has been active. She states that she vomited once today. She states that she is drinking about 1-2 glasses of water per day.   Abdominal Pain This is a new problem. The current episode started in the past 7 days. The onset quality is gradual. The problem occurs intermittently. The problem has been gradually worsening. The pain is located in the generalized abdominal region. The pain is at a severity of 7/10. The quality of the pain is cramping. The abdominal pain does not radiate. Associated symptoms include vomiting (x 1 today ). Pertinent negatives include no constipation, diarrhea, dysuria, fever, frequency or nausea. Nothing aggravates the pain. The pain is relieved by nothing. She has tried nothing for the symptoms.   Past Medical History  Diagnosis Date  . Medical history non-contributory     Past Surgical History  Procedure Laterality Date  . Mouth surgery    . Oral cyst removal      History reviewed. No pertinent family history.  Social History  Substance Use Topics  . Smoking status: Never Smoker   . Smokeless tobacco: Never Used  . Alcohol Use: No    Allergies:  Allergies  Allergen Reactions  . Amoxicillin   . Penicillins   . Latex Rash    Prescriptions prior to admission  Medication Sig Dispense Refill Last Dose  . acetaminophen (TYLENOL) 325 MG tablet Take 650 mg by mouth every 6 (six) hours as needed for headache.   Unknown at Unknown time  . Naphazoline-Glycerin (CLEAR EYES MAX REDNESS RELIEF) 0.03-0.5 % SOLN Place 1 drop into both eyes daily as needed  (For redness.).   Unknown at Unknown time  . Prenatal Vit-Fe Fumarate-FA (PRENATAL MULTIVITAMIN) TABS tablet Take 1 tablet by mouth daily at 12 noon.   08/19/2014 at Unknown time  . Prenatal Vit-Fe Fumarate-FA (PRENATAL VITAMIN) 27-0.8 MG TABS Take 1 tablet by mouth daily. 30 tablet 1     Review of Systems  Constitutional: Negative for fever.  Gastrointestinal: Positive for vomiting (x 1 today ) and abdominal pain. Negative for nausea, diarrhea and constipation.  Genitourinary: Negative for dysuria, urgency and frequency.       Patient states that her urine is very dark. She is worried there may be blood in her urine.    Physical Exam   Blood pressure 112/72, pulse 80, temperature 98.4 F (36.9 C), temperature source Oral, resp. rate 16, height  (1.575 m), weight 63.504 kg (140 lb), last menstrual period 12/29/2013, SpO2 98 %.  Physical Exam  Nursing note and vitals reviewed. Constitutional: She is oriented to person, place, and time. She appears well-developed and well-nourished. No distress.  HENT:  Head: Normocephalic.  Cardiovascular: Normal rate.   Respiratory: Effort normal.  GI: Soft. There is no tenderness. There is no rebound.  Genitourinary:  Cervix: closed/thick/high   Neurological: She is alert and oriented to person, place, and time.  Skin: Skin is warm and dry.  Psychiatric: She has a normal mood and affect.   Results for orders placed or performed during the hospital  encounter of 08/31/14 (from the past 24 hour(s))  Urinalysis, Routine w reflex microscopic (not at [facility])     Status: Abnormal   Collection Time: 08/31/14  3:00 AM  Result Value Ref Range   Color, Urine YELLOW YELLOW   APPearance CLEAR CLEAR   Specific Gravity, Urine 1.025 1.005 - 1.030   pH 6.0 5.0 - 8.0   Glucose, UA NEGATIVE NEGATIVE mg/dL   Hgb urine dipstick SMALL (A) NEGATIVE   Bilirubin Urine NEGATIVE NEGATIVE   Ketones, ur 15 (A) NEGATIVE mg/dL   Protein, ur NEGATIVE NEGATIVE mg/dL    Urobilinogen, UA 1.0 0.0 - 1.0 mg/dL   Nitrite NEGATIVE NEGATIVE   Leukocytes, UA TRACE (A) NEGATIVE  Urine microscopic-add on     Status: Abnormal   Collection Time: 08/31/14  3:00 AM  Result Value Ref Range   Squamous Epithelial / LPF RARE RARE   WBC, UA 0-2 <3 WBC/hpf   RBC / HPF 0-2 <3 RBC/hpf   Bacteria, UA RARE RARE   Crystals CA OXALATE CRYSTALS (A) NEGATIVE   Urine-Other MUCOUS PRESENT   Amnisure rupture of membrane (rom)not at [facility]     Status: None   Collection Time: 08/31/14  3:22 AM  Result Value Ref Range   Amnisure ROM NEGATIVE     FHT: 120, moderate with 15x15 accels, no decels  Toco: no UC  MAU Course  Procedures  MDM Patient has had phenergan. She is tolerating PO  Assessment and Plan   1. Nausea/vomiting in pregnancy    DC home Comfort measures reviewed  3rd Trimester precautions  Increase PO hydration PTL precautions  Fetal kick counts RX: phenergan #[id number]  Return to MAU as needed FU with OB as planned  Follow-up Information    Follow up with Levi Aland, MD.   Specialty:  Obstetrics and Gynecology   Why:  As scheduled   Contact information:   [address] [place] [address] [phone]         Tawnya Crook 08/31/2014, 4:05 AM

## 2014-08-31 NOTE — MAU Note (Signed)
Pt reports she has had abd pain x 24 hours and has been nauseated tonight. States she thinks she has blood in her urine and also that she may be leaking fluid.

## 2014-08-31 NOTE — Discharge Instructions (Signed)
Braxton Hicks Contractions °Contractions of the uterus can occur throughout pregnancy. Contractions are not always a sign that you are in labor.  °WHAT ARE BRAXTON HICKS CONTRACTIONS?  °Contractions that occur before labor are called Braxton Hicks contractions, or false labor. Toward the end of pregnancy (32-34 weeks), these contractions can develop more often and may become more forceful. This is not true labor because these contractions do not result in opening (dilatation) and thinning of the cervix. They are sometimes difficult to tell apart from true labor because these contractions can be forceful and people have different pain tolerances. You should not feel embarrassed if you go to the hospital with false labor. Sometimes, the only way to tell if you are in true labor is for your health care provider to look for changes in the cervix. °If there are no prenatal problems or other health problems associated with the pregnancy, it is completely safe to be sent home with false labor and await the onset of true labor. °HOW CAN YOU TELL THE DIFFERENCE BETWEEN TRUE AND FALSE LABOR? °False Labor °· The contractions of false labor are usually shorter and not as hard as those of true labor.   °· The contractions are usually irregular.   °· The contractions are often felt in the front of the lower abdomen and in the groin.   °· The contractions may go away when you walk around or change positions while lying down.   °· The contractions get weaker and are shorter lasting as time goes on.   °· The contractions do not usually become progressively stronger, regular, and closer together as with true labor.   °True Labor °· Contractions in true labor last 30-70 seconds, become very regular, usually become more intense, and increase in frequency.   °· The contractions do not go away with walking.   °· The discomfort is usually felt in the top of the uterus and spreads to the lower abdomen and low back.   °· True labor can be  determined by your health care provider with an exam. This will show that the cervix is dilating and getting thinner.   °WHAT TO REMEMBER °· Keep up with your usual exercises and follow other instructions given by your health care provider.   °· Take medicines as directed by your health care provider.   °· Keep your regular prenatal appointments.   °· Eat and drink lightly if you think you are going into labor.   °· If Braxton Hicks contractions are making you uncomfortable:   °¨ Change your position from lying down or resting to walking, or from walking to resting.   °¨ Sit and rest in a tub of warm water.   °¨ Drink 2-3 glasses of water. Dehydration may cause these contractions.   °¨ Do slow and deep breathing several times an hour.   °WHEN SHOULD I SEEK IMMEDIATE MEDICAL CARE? °Seek immediate medical care if: °· Your contractions become stronger, more regular, and closer together.   °· You have fluid leaking or gushing from your vagina.   °· You have a fever.   °· You pass blood-tinged mucus.   °· You have vaginal bleeding.   °· You have continuous abdominal pain.   °· You have low back pain that you never had before.   °· You feel your baby's head pushing down and causing pelvic pressure.   °· Your baby is not moving as much as it used to.   °Document Released: 12/25/2004 Document Revised: 12/30/2012 Document Reviewed: 10/06/2012 °[employer]® Patient Information ©2015 [employer]. This information is not intended to replace advice given to you by your health care   provider. Make sure you discuss any questions you have with your health care provider. ° °

## 2014-09-02 LAB — OB RESULTS CONSOLE GBS: GBS: NEGATIVE

## 2014-09-24 ENCOUNTER — Other Ambulatory Visit (HOSPITAL_COMMUNITY): Payer: Self-pay | Admitting: Obstetrics and Gynecology

## 2014-09-24 ENCOUNTER — Encounter (HOSPITAL_COMMUNITY): Payer: Self-pay | Admitting: Obstetrics and Gynecology

## 2014-09-24 ENCOUNTER — Institutional Professional Consult (permissible substitution): Payer: Self-pay | Admitting: Pediatrics

## 2014-09-24 ENCOUNTER — Ambulatory Visit (HOSPITAL_COMMUNITY)
Admission: RE | Admit: 2014-09-24 | Discharge: 2014-09-24 | Disposition: A | Discharge status: Home / Self Care | Payer: Medicaid Other | Source: Ambulatory Visit | Attending: Obstetrics and Gynecology | Admitting: Obstetrics and Gynecology

## 2014-09-24 DIAGNOSIS — Z3A38 38 weeks gestation of pregnancy: Secondary | ICD-10-CM | POA: Insufficient documentation

## 2014-09-24 DIAGNOSIS — O365930 Maternal care for other known or suspected poor fetal growth, third trimester, not applicable or unspecified: Secondary | ICD-10-CM | POA: Insufficient documentation

## 2014-09-24 DIAGNOSIS — O26843 Uterine size-date discrepancy, third trimester: Secondary | ICD-10-CM | POA: Insufficient documentation

## 2014-09-27 ENCOUNTER — Institutional Professional Consult (permissible substitution): Payer: Self-pay

## 2014-09-28 ENCOUNTER — Inpatient Hospital Stay (HOSPITAL_COMMUNITY)
Admission: AD | Admit: 2014-09-28 | Discharge: 2014-09-28 | Disposition: A | Discharge status: Home / Self Care | Payer: Medicaid Other | Source: Ambulatory Visit | Attending: Obstetrics | Admitting: Obstetrics

## 2014-09-28 ENCOUNTER — Encounter (HOSPITAL_COMMUNITY): Payer: Self-pay

## 2014-09-28 ENCOUNTER — Encounter (HOSPITAL_COMMUNITY): Payer: Self-pay | Admitting: *Deleted

## 2014-09-28 ENCOUNTER — Telehealth (HOSPITAL_COMMUNITY): Payer: Self-pay | Admitting: *Deleted

## 2014-09-28 ENCOUNTER — Inpatient Hospital Stay (HOSPITAL_COMMUNITY)
Admission: AD | Admit: 2014-09-28 | Discharge: 2014-09-28 | Disposition: A | Discharge status: Home / Self Care | Payer: Medicaid Other | Source: Ambulatory Visit | Attending: Obstetrics and Gynecology | Admitting: Obstetrics and Gynecology

## 2014-09-28 DIAGNOSIS — Z3493 Encounter for supervision of normal pregnancy, unspecified, third trimester: Secondary | ICD-10-CM | POA: Insufficient documentation

## 2014-09-28 DIAGNOSIS — O479 False labor, unspecified: Secondary | ICD-10-CM

## 2014-09-28 DIAGNOSIS — R55 Syncope and collapse: Secondary | ICD-10-CM

## 2014-09-28 LAB — COMPREHENSIVE METABOLIC PANEL
ALT: 20 U/L (ref 14–54)
ANION GAP: 7 (ref 5–15)
AST: 22 U/L (ref 15–41)
Albumin: 3.2 g/dL — ABNORMAL LOW (ref 3.5–5.0)
Alkaline Phosphatase: 188 U/L — ABNORMAL HIGH (ref 38–126)
BILIRUBIN TOTAL: 0.5 mg/dL (ref 0.3–1.2)
BUN: 8 mg/dL (ref 6–20)
CO2: 22 mmol/L (ref 22–32)
Calcium: 8.8 mg/dL — ABNORMAL LOW (ref 8.9–10.3)
Chloride: 105 mmol/L (ref 101–111)
Creatinine, Ser: 0.55 mg/dL (ref 0.44–1.00)
Glucose, Bld: 81 mg/dL (ref 65–99)
POTASSIUM: 4.3 mmol/L (ref 3.5–5.1)
Sodium: 134 mmol/L — ABNORMAL LOW (ref 135–145)
TOTAL PROTEIN: 7.4 g/dL (ref 6.5–8.1)

## 2014-09-28 LAB — CBC
HEMATOCRIT: 35.6 % — AB (ref 36.0–46.0)
Hemoglobin: 11.7 g/dL — ABNORMAL LOW (ref 12.0–15.0)
MCH: 27.9 pg (ref 26.0–34.0)
MCHC: 32.9 g/dL (ref 30.0–36.0)
MCV: 85 fL (ref 78.0–100.0)
Platelets: 185 10*3/uL (ref 150–400)
RBC: 4.19 MIL/uL (ref 3.87–5.11)
RDW: 13.5 % (ref 11.5–15.5)
WBC: 9 10*3/uL (ref 4.0–10.5)

## 2014-09-28 LAB — URINALYSIS, ROUTINE W REFLEX MICROSCOPIC
BILIRUBIN URINE: NEGATIVE
GLUCOSE, UA: NEGATIVE mg/dL
HGB URINE DIPSTICK: NEGATIVE
Ketones, ur: 15 mg/dL — AB
Leukocytes, UA: NEGATIVE
Nitrite: NEGATIVE
Protein, ur: NEGATIVE mg/dL
SPECIFIC GRAVITY, URINE: 1.015 (ref 1.005–1.030)
Urobilinogen, UA: 1 mg/dL (ref 0.0–1.0)
pH: 7 (ref 5.0–8.0)

## 2014-09-28 MED ORDER — LACTATED RINGERS IV BOLUS (SEPSIS)
1000.0000 mL | Freq: Once | INTRAVENOUS | Status: AC
  Administered 2014-09-28: 1000 mL via INTRAVENOUS

## 2014-09-28 MED ORDER — LACTATED RINGERS IV SOLN
INTRAVENOUS | Status: DC
  Administered 2014-09-28: 16:00:00 via INTRAVENOUS

## 2014-09-28 NOTE — MAU Note (Signed)
Discharge instructions given to patient and significant other. Explained Dr. Ophelia Charter understanding of her discomfort and how cervical change was necessary for admission. Reassured patient of normalcy and what to come back to MAU for. Instructed patient to keep her appointment with Dr. Dareen Piano today. Significant other was concerned about patient and questioning discharge but was agreeable to plan. Patient expressed concern about labor and wanted to know if it will "go smoothly." Reassurance provided for patient and significant other. Significant other expressed gratitude for explanation and agreeable to go home

## 2014-09-28 NOTE — MAU Note (Signed)
Pt presents via EMS for a labor evaluation> Denies leaking or bleeding. Reports good fetal movement.

## 2014-09-28 NOTE — Discharge Instructions (Signed)
Third Trimester of Pregnancy °The third trimester is from week 29 through week 42, months 7 through 9. The third trimester is a time when the fetus is growing rapidly. At the end of the ninth month, the fetus is about 20 inches in length and weighs 6-10 pounds.  °BODY CHANGES °Your body goes through many changes during pregnancy. The changes vary from woman to woman.  °· Your weight will continue to increase. You can expect to gain 25-35 pounds (11-16 kg) by the end of the pregnancy. °· You may begin to get stretch marks on your hips, abdomen, and breasts. °· You may urinate more often because the fetus is moving lower into your pelvis and pressing on your bladder. °· You may develop or continue to have heartburn as a result of your pregnancy. °· You may develop constipation because certain hormones are causing the muscles that push waste through your intestines to slow down. °· You may develop hemorrhoids or swollen, bulging veins (varicose veins). °· You may have pelvic pain because of the weight gain and pregnancy hormones relaxing your joints between the bones in your pelvis. Backaches may result from overexertion of the muscles supporting your posture. °· You may have changes in your hair. These can include thickening of your hair, rapid growth, and changes in texture. Some women also have hair loss during or after pregnancy, or hair that feels dry or thin. Your hair will most likely return to normal after your baby is born. °· Your breasts will continue to grow and be tender. A yellow discharge may leak from your breasts called colostrum. °· Your belly button may stick out. °· You may feel short of breath because of your expanding uterus. °· You may notice the fetus "dropping," or moving lower in your abdomen. °· You may have a bloody mucus discharge. This usually occurs a few days to a week before labor begins. °· Your cervix becomes thin and soft (effaced) near your due date. °WHAT TO EXPECT AT YOUR PRENATAL  EXAMS  °You will have prenatal exams every 2 weeks until week 36. Then, you will have weekly prenatal exams. During a routine prenatal visit: °· You will be weighed to make sure you and the fetus are growing normally. °· Your blood pressure is taken. °· Your abdomen will be measured to track your baby's growth. °· The fetal heartbeat will be listened to. °· Any test results from the previous visit will be discussed. °· You may have a cervical check near your due date to see if you have effaced. °At around 36 weeks, your caregiver will check your cervix. At the same time, your caregiver will also perform a test on the secretions of the vaginal tissue. This test is to determine if a type of bacteria, Group B streptococcus, is present. Your caregiver will explain this further. °Your caregiver may ask you: °· What your birth plan is. °· How you are feeling. °· If you are feeling the baby move. °· If you have had any abnormal symptoms, such as leaking fluid, bleeding, severe headaches, or abdominal cramping. °· If you have any questions. °Other tests or screenings that may be performed during your third trimester include: °· Blood tests that check for low iron levels (anemia). °· Fetal testing to check the health, activity level, and growth of the fetus. Testing is done if you have certain medical conditions or if there are problems during the pregnancy. °FALSE LABOR °You may feel small, irregular contractions that   eventually go away. These are called Braxton Hicks contractions, or false labor. Contractions may last for hours, days, or even weeks before true labor sets in. If contractions come at regular intervals, intensify, or become painful, it is best to be seen by your caregiver.  °SIGNS OF LABOR  °· Menstrual-like cramps. °· Contractions that are 5 minutes apart or less. °· Contractions that start on the top of the uterus and spread down to the lower abdomen and back. °· A sense of increased pelvic pressure or back  pain. °· A watery or bloody mucus discharge that comes from the vagina. °If you have any of these signs before the 37th week of pregnancy, call your caregiver right away. You need to go to the hospital to get checked immediately. °HOME CARE INSTRUCTIONS  °· Avoid all smoking, herbs, alcohol, and unprescribed drugs. These chemicals affect the formation and growth of the baby. °· Follow your caregiver's instructions regarding medicine use. There are medicines that are either safe or unsafe to take during pregnancy. °· Exercise only as directed by your caregiver. Experiencing uterine cramps is a good sign to stop exercising. °· Continue to eat regular, healthy meals. °· Wear a good support bra for breast tenderness. °· Do not use hot tubs, steam rooms, or saunas. °· Wear your seat belt at all times when driving. °· Avoid raw meat, uncooked cheese, cat litter boxes, and soil used by cats. These carry germs that can cause birth defects in the baby. °· Take your prenatal vitamins. °· Try taking a stool softener (if your caregiver approves) if you develop constipation. Eat more high-fiber foods, such as fresh vegetables or fruit and whole grains. Drink plenty of fluids to keep your urine clear or pale yellow. °· Take warm sitz baths to soothe any pain or discomfort caused by hemorrhoids. Use hemorrhoid cream if your caregiver approves. °· If you develop varicose veins, wear support hose. Elevate your feet for 15 minutes, 3-4 times a day. Limit salt in your diet. °· Avoid heavy lifting, wear low heal shoes, and practice good posture. °· Rest a lot with your legs elevated if you have leg cramps or low back pain. °· Visit your dentist if you have not gone during your pregnancy. Use a soft toothbrush to brush your teeth and be gentle when you floss. °· A sexual relationship may be continued unless your caregiver directs you otherwise. °· Do not travel far distances unless it is absolutely necessary and only with the approval  of your caregiver. °· Take prenatal classes to understand, practice, and ask questions about the labor and delivery. °· Make a trial run to the hospital. °· Pack your hospital bag. °· Prepare the baby's nursery. °· Continue to go to all your prenatal visits as directed by your caregiver. °SEEK MEDICAL CARE IF: °· You are unsure if you are in labor or if your water has broken. °· You have dizziness. °· You have mild pelvic cramps, pelvic pressure, or nagging pain in your abdominal area. °· You have persistent nausea, vomiting, or diarrhea. °· You have a bad smelling vaginal discharge. °· You have pain with urination. °SEEK IMMEDIATE MEDICAL CARE IF:  °· You have a fever. °· You are leaking fluid from your vagina. °· You have spotting or bleeding from your vagina. °· You have severe abdominal cramping or pain. °· You have rapid weight loss or gain. °· You have shortness of breath with chest pain. °· You notice sudden or extreme swelling   of your face, hands, ankles, feet, or legs. °· You have not felt your baby move in over an hour. °· You have severe headaches that do not go away with medicine. °· You have vision changes. °Document Released: 12/19/2000 Document Revised: 12/30/2012 Document Reviewed: 02/26/2012 °[employer]® Patient Information ©2015 [employer]. This information is not intended to replace advice given to you by your health care provider. Make sure you discuss any questions you have with your health care provider. °Fetal Movement Counts °Patient Name: __________________________________________________ Patient Due Date: ____________________ °Performing a fetal movement count is highly recommended in high-risk pregnancies, but it is good for every pregnant woman to do. Your health care provider may ask you to start counting fetal movements at 28 weeks of the pregnancy. Fetal movements often increase: °· After eating a full meal. °· After physical activity. °· After eating or drinking something sweet or  cold. °· At rest. °Pay attention to when you feel the baby is most active. This will help you notice a pattern of your baby's sleep and wake cycles and what factors contribute to an increase in fetal movement. It is important to perform a fetal movement count at the same time each day when your baby is normally most active.  °HOW TO COUNT FETAL MOVEMENTS °· Find a quiet and comfortable area to sit or lie down on your left side. Lying on your left side provides the best blood and oxygen circulation to your baby. °· Write down the day and time on a sheet of paper or in a journal. °· Start counting kicks, flutters, swishes, rolls, or jabs in a 2-hour period. You should feel at least 10 movements within 2 hours. °· If you do not feel 10 movements in 2 hours, wait 2-3 hours and count again. Look for a change in the pattern or not enough counts in 2 hours. °SEEK MEDICAL CARE IF: °· You feel less than 10 counts in 2 hours, tried twice. °· There is no movement in over an hour. °· The pattern is changing or taking longer each day to reach 10 counts in 2 hours. °· You feel the baby is not moving as he or she usually does. °Date: ____________ Movements: ____________ Start time: ____________ Finish time: ____________  °Date: ____________ Movements: ____________ Start time: ____________ Finish time: ____________ °Date: ____________ Movements: ____________ Start time: ____________ Finish time: ____________ °Date: ____________ Movements: ____________ Start time: ____________ Finish time: ____________ °Date: ____________ Movements: ____________ Start time: ____________ Finish time: ____________ °Date: ____________ Movements: ____________ Start time: ____________ Finish time: ____________ °Date: ____________ Movements: ____________ Start time: ____________ Finish time: ____________ °Date: ____________ Movements: ____________ Start time: ____________ Finish time: ____________  °Date: ____________ Movements: ____________ Start time:  ____________ Finish time: ____________ °Date: ____________ Movements: ____________ Start time: ____________ Finish time: ____________ °Date: ____________ Movements: ____________ Start time: ____________ Finish time: ____________ °Date: ____________ Movements: ____________ Start time: ____________ Finish time: ____________ °Date: ____________ Movements: ____________ Start time: ____________ Finish time: ____________ °Date: ____________ Movements: ____________ Start time: ____________ Finish time: ____________ °Date: ____________ Movements: ____________ Start time: ____________ Finish time: ____________  °Date: ____________ Movements: ____________ Start time: ____________ Finish time: ____________ °Date: ____________ Movements: ____________ Start time: ____________ Finish time: ____________ °Date: ____________ Movements: ____________ Start time: ____________ Finish time: ____________ °Date: ____________ Movements: ____________ Start time: ____________ Finish time: ____________ °Date: ____________ Movements: ____________ Start time: ____________ Finish time: ____________ °Date: ____________ Movements: ____________ Start time: ____________ Finish time: ____________ °Date: ____________ Movements: ____________ Start time: ____________ Finish time:   ____________  °Date: ____________ Movements: ____________ Start time: ____________ Finish time: ____________ °Date: ____________ Movements: ____________ Start time: ____________ Finish time: ____________ °Date: ____________ Movements: ____________ Start time: ____________ Finish time: ____________ °Date: ____________ Movements: ____________ Start time: ____________ Finish time: ____________ °Date: ____________ Movements: ____________ Start time: ____________ Finish time: ____________ °Date: ____________ Movements: ____________ Start time: ____________ Finish time: ____________ °Date: ____________ Movements: ____________ Start time: ____________ Finish time: ____________  °Date:  ____________ Movements: ____________ Start time: ____________ Finish time: ____________ °Date: ____________ Movements: ____________ Start time: ____________ Finish time: ____________ °Date: ____________ Movements: ____________ Start time: ____________ Finish time: ____________ °Date: ____________ Movements: ____________ Start time: ____________ Finish time: ____________ °Date: ____________ Movements: ____________ Start time: ____________ Finish time: ____________ °Date: ____________ Movements: ____________ Start time: ____________ Finish time: ____________ °Date: ____________ Movements: ____________ Start time: ____________ Finish time: ____________  °Date: ____________ Movements: ____________ Start time: ____________ Finish time: ____________ °Date: ____________ Movements: ____________ Start time: ____________ Finish time: ____________ °Date: ____________ Movements: ____________ Start time: ____________ Finish time: ____________ °Date: ____________ Movements: ____________ Start time: ____________ Finish time: ____________ °Date: ____________ Movements: ____________ Start time: ____________ Finish time: ____________ °Date: ____________ Movements: ____________ Start time: ____________ Finish time: ____________ °Date: ____________ Movements: ____________ Start time: ____________ Finish time: ____________  °Date: ____________ Movements: ____________ Start time: ____________ Finish time: ____________ °Date: ____________ Movements: ____________ Start time: ____________ Finish time: ____________ °Date: ____________ Movements: ____________ Start time: ____________ Finish time: ____________ °Date: ____________ Movements: ____________ Start time: ____________ Finish time: ____________ °Date: ____________ Movements: ____________ Start time: ____________ Finish time: ____________ °Date: ____________ Movements: ____________ Start time: ____________ Finish time: ____________ °Date: ____________ Movements: ____________ Start  time: ____________ Finish time: ____________  °Date: ____________ Movements: ____________ Start time: ____________ Finish time: ____________ °Date: ____________ Movements: ____________ Start time: ____________ Finish time: ____________ °Date: ____________ Movements: ____________ Start time: ____________ Finish time: ____________ °Date: ____________ Movements: ____________ Start time: ____________ Finish time: ____________ °Date: ____________ Movements: ____________ Start time: ____________ Finish time: ____________ °Date: ____________ Movements: ____________ Start time: ____________ Finish time: ____________ °Document Released: 01/24/2006 Document Revised: 05/11/2013 Document Reviewed: 10/22/2011 °[employer]® Patient Information ©2015 [employer]. This information is not intended to replace advice given to you by your health care provider. Make sure you discuss any questions you have with your health care provider. °Braxton Hicks Contractions °Contractions of the uterus can occur throughout pregnancy. Contractions are not always a sign that you are in labor.  °WHAT ARE BRAXTON HICKS CONTRACTIONS?  °Contractions that occur before labor are called Braxton Hicks contractions, or false labor. Toward the end of pregnancy (32-34 weeks), these contractions can develop more often and may become more forceful. This is not true labor because these contractions do not result in opening (dilatation) and thinning of the cervix. They are sometimes difficult to tell apart from true labor because these contractions can be forceful and people have different pain tolerances. You should not feel embarrassed if you go to the hospital with false labor. Sometimes, the only way to tell if you are in true labor is for your health care provider to look for changes in the cervix. °If there are no prenatal problems or other health problems associated with the pregnancy, it is completely safe to be sent home with false labor and await the  onset of true labor. °HOW CAN YOU TELL THE DIFFERENCE BETWEEN TRUE AND FALSE LABOR? °False Labor °· The contractions of false labor are usually shorter and not as hard as those of true labor.   °· The contractions   are usually irregular.   °· The contractions are often felt in the front of the lower abdomen and in the groin.   °· The contractions may go away when you walk around or change positions while lying down.   °· The contractions get weaker and are shorter lasting as time goes on.   °· The contractions do not usually become progressively stronger, regular, and closer together as with true labor.   °True Labor °· Contractions in true labor last 30-70 seconds, become very regular, usually become more intense, and increase in frequency.   °· The contractions do not go away with walking.   °· The discomfort is usually felt in the top of the uterus and spreads to the lower abdomen and low back.   °· True labor can be determined by your health care provider with an exam. This will show that the cervix is dilating and getting thinner.   °WHAT TO REMEMBER °· Keep up with your usual exercises and follow other instructions given by your health care provider.   °· Take medicines as directed by your health care provider.   °· Keep your regular prenatal appointments.   °· Eat and drink lightly if you think you are going into labor.   °· If Braxton Hicks contractions are making you uncomfortable:   °· Change your position from lying down or resting to walking, or from walking to resting.   °· Sit and rest in a tub of warm water.   °· Drink 2-3 glasses of water. Dehydration may cause these contractions.   °· Do slow and deep breathing several times an hour.   °WHEN SHOULD I SEEK IMMEDIATE MEDICAL CARE? °Seek immediate medical care if: °· Your contractions become stronger, more regular, and closer together.   °· You have fluid leaking or gushing from your vagina.   °· You have a fever.   °· You pass blood-tinged mucus.    °· You have vaginal bleeding.   °· You have continuous abdominal pain.   °· You have low back pain that you never had before.   °· You feel your baby's head pushing down and causing pelvic pressure.   °· Your baby is not moving as much as it used to.   °Document Released: 12/25/2004 Document Revised: 12/30/2012 Document Reviewed: 10/06/2012 °[employer]® Patient Information ©2015 [employer]. This information is not intended to replace advice given to you by your health care provider. Make sure you discuss any questions you have with your health care provider. ° °

## 2014-09-28 NOTE — Discharge Instructions (Signed)
Near-Syncope Near-syncope (commonly known as near fainting) is sudden weakness, dizziness, or feeling like you might pass out. During an episode of near-syncope, you may also develop pale skin, have tunnel vision, or feel sick to your stomach (nauseous). Near-syncope may occur when getting up after sitting or while standing for a long time. It is caused by a sudden decrease in blood flow to the brain. This decrease can result from various causes or triggers, most of which are not serious. However, because near-syncope can sometimes be a sign of something serious, a medical evaluation is required. The specific cause is often not determined. HOME CARE INSTRUCTIONS  Monitor your condition for any changes. The following actions may help to alleviate any discomfort you are experiencing:  Have someone stay with you until you feel stable.  Lie down right away and prop your feet up if you start feeling like you might faint. Breathe deeply and steadily. Wait until all the symptoms have passed. Most of these episodes last only a few minutes. You may feel tired for several hours.   Drink enough fluids to keep your urine clear or pale yellow.   If you are taking blood pressure or heart medicine, get up slowly when seated or lying down. Take several minutes to sit and then stand. This can reduce dizziness.  Follow up with your health care provider as directed. SEEK IMMEDIATE MEDICAL CARE IF:   You have a severe headache.   You have unusual pain in the chest, abdomen, or back.   You are bleeding from the mouth or rectum, or you have black or tarry stool.   You have an irregular or very fast heartbeat.   You have repeated fainting or have seizure-like jerking during an episode.   You faint when sitting or lying down.   You have confusion.   You have difficulty walking.   You have severe weakness.   You have vision problems.  MAKE SURE YOU:   Understand these instructions.  Will  watch your condition.  Will get help right away if you are not doing well or get worse. Document Released: 12/25/2004 Document Revised: 12/30/2012 Document Reviewed: 05/30/2012 [facility] Patient Information 2015 [place], [place]. This information is not intended to replace advice given to you by your health care provider. Make sure you discuss any questions you have with your health care provider.  Braxton Hicks Contractions Contractions of the uterus can occur throughout pregnancy. Contractions are not always a sign that you are in labor.  WHAT ARE BRAXTON HICKS CONTRACTIONS?  Contractions that occur before labor are called Braxton Hicks contractions, or false labor. Toward the end of pregnancy (32-34 weeks), these contractions can develop more often and may become more forceful. This is not true labor because these contractions do not result in opening (dilatation) and thinning of the cervix. They are sometimes difficult to tell apart from true labor because these contractions can be forceful and people have different pain tolerances. You should not feel embarrassed if you go to the hospital with false labor. Sometimes, the only way to tell if you are in true labor is for your health care provider to look for changes in the cervix. If there are no prenatal problems or other health problems associated with the pregnancy, it is completely safe to be sent home with false labor and await the onset of true labor. HOW CAN YOU TELL THE DIFFERENCE BETWEEN TRUE AND FALSE LABOR? False Labor  The contractions of false labor are usually  shorter and not as hard as those of true labor.   The contractions are usually irregular.   The contractions are often felt in the front of the lower abdomen and in the groin.   The contractions may go away when you walk around or change positions while lying down.   The contractions get weaker and are shorter lasting as time goes on.   The contractions do not  usually become progressively stronger, regular, and closer together as with true labor.  True Labor  Contractions in true labor last 30-70 seconds, become very regular, usually become more intense, and increase in frequency.   The contractions do not go away with walking.   The discomfort is usually felt in the top of the uterus and spreads to the lower abdomen and low back.   True labor can be determined by your health care provider with an exam. This will show that the cervix is dilating and getting thinner.  WHAT TO REMEMBER  Keep up with your usual exercises and follow other instructions given by your health care provider.   Take medicines as directed by your health care provider.   Keep your regular prenatal appointments.   Eat and drink lightly if you think you are going into labor.   If Braxton Hicks contractions are making you uncomfortable:   Change your position from lying down or resting to walking, or from walking to resting.   Sit and rest in a tub of warm water.   Drink 2-3 glasses of water. Dehydration may cause these contractions.   Do slow and deep breathing several times an hour.  WHEN SHOULD I SEEK IMMEDIATE MEDICAL CARE? Seek immediate medical care if:  Your contractions become stronger, more regular, and closer together.   You have fluid leaking or gushing from your vagina.   You have a fever.   You pass blood-tinged mucus.   You have vaginal bleeding.   You have continuous abdominal pain.   You have low back pain that you never had before.   You feel your baby's head pushing down and causing pelvic pressure.   Your baby is not moving as much as it used to.  Document Released: 12/25/2004 Document Revised: 12/30/2012 Document Reviewed: 10/06/2012 [facility] Patient Information 2015 [place], [place]. This information is not intended to replace advice given to you by your health care provider. Make sure you discuss any questions  you have with your health care provider.

## 2014-09-28 NOTE — MAU Provider Note (Signed)
Chief Complaint:  No chief complaint on file.   First Provider Initiated Contact with Patient 09/28/14 1521      HPI: Rose Gibbs is a 19 y.o. G2P0010 at [redacted]w[redacted]d who presents to maternity admissions reporting having a near syncopal episode immediately before coming to maternity admissions. FOB, mother and stepmother were arguing about who was going to take the patient to her doctors appointment. This made her very anxious and she was already feeling hot and dehydrated. She states she started just feel very tired and weak but did not lose consciousness. Her boyfriend states she was closing her eyes,  but still talking to him and did not appear to lose consciousness. He states that she did not have any jerking movements. She states she has had a few other episodes like this throughout the pregnancy when she was under a lot of stress. No episodes outside of pregnancy.  Duration: ~10 minutes  Context: Anxiety-provoking situation, hot environment Timing: Constant times approximately 10 minutes with gradual improvement. Modifying factors: Improved when removed from anxiety provoking situation and heat. Associated signs and symptoms: Negative for fever, chills, jerking movements, leaking of urine, biting of tongue, syncope.  Denies contractions, leakage of fluid or vaginal bleeding. Good fetal movement.   Pregnancy Course: Measuring size less than dates   Past Medical History: Past Medical History  Diagnosis Date  . Medical history non-contributory   . Hx of varicella     Past obstetric history: OB History  Gravida Para Term Preterm AB SAB TAB Ectopic Multiple Living  2 0 0 0 1 1 0 0  0    # Outcome Date GA Lbr Len/2nd Weight Sex Delivery Anes PTL Lv  2 Current           1 SAB               Past Surgical History: Past Surgical History  Procedure Laterality Date  . Mouth surgery    . Oral cyst removal       Family History: History reviewed. No pertinent family history.  Social  History: Social History  Substance Use Topics  . Smoking status: Never Smoker   . Smokeless tobacco: Never Used  . Alcohol Use: No    Allergies:  Allergies  Allergen Reactions  . Amoxicillin Other (See Comments)    Childhood reaction  . Penicillins     Has patient had a PCN reaction causing immediate rash, facial/tongue/throat swelling, SOB or lightheadedness with hypotension: Yes Has patient had a PCN reaction causing severe rash involving mucus membranes or skin necrosis: No Has patient had a PCN reaction that required hospitalization Yes Has patient had a PCN reaction occurring within the last 10 years: No If all of the above answers are "NO", then may proceed with Cephalosporin use.  . Latex Rash    Meds:  Prescriptions prior to admission  Medication Sig Dispense Refill Last Dose  . acetaminophen (TYLENOL) 325 MG tablet Take 325 mg by mouth every 6 (six) hours as needed for headache.    Past Week at Unknown time  . calcium carbonate (TUMS - DOSED IN MG ELEMENTAL CALCIUM) 500 MG chewable tablet Chew 2 tablets by mouth 3 (three) times daily as needed for indigestion or heartburn.   09/27/2014 at Unknown time  . Prenatal Vit-Fe Fumarate-FA (PRENATAL VITAMIN) 27-0.8 MG TABS Take 1 tablet by mouth daily. 30 tablet 1 09/27/2014 at Unknown time    I have reviewed patient's Past Medical Hx, Surgical Hx, Family  Hx, Social Hx, medications and allergies.   ROS:  Review of Systems  Constitutional: Positive for fatigue. Negative for fever and chills.  Respiratory: Negative for shortness of breath.   Cardiovascular: Negative for chest pain and palpitations.  Gastrointestinal: Negative for abdominal pain.  Genitourinary: Negative for vaginal bleeding.  Neurological: Positive for weakness and light-headedness. Negative for dizziness, seizures, syncope, speech difficulty and numbness.  Psychiatric/Behavioral: The patient is nervous/anxious.     Physical Exam   Patient Vitals for the  past 24 hrs:  BP Temp Temp src Pulse Resp SpO2  09/28/14 1527 132/76 mmHg - - 77 - -  09/28/14 1526 127/74 mmHg - - 73 - -  09/28/14 1524 122/79 mmHg - - 77 18 -  09/28/14 1439 126/81 mmHg 97.6 F (36.4 C) Oral 80 18 100 %   Constitutional: Well-developed, well-nourished female in no acute distress.  no pallor.  Cardiovascular: normal rate Respiratory: normal effort GI: Abd soft, non-tender, gravid, size less than dates. MS: Extremities nontender, no edema, normal ROM Neurologic: Alert and oriented x 4.  normal speech and gait  GU: Neg CVAT.  Pelvic: NEFG, physiologic discharge, no blood, cervix clean. No CMT  Dilation: Closed Effacement (%): Thick Cervical Position: Middle Exam by:: L. Paschal, RN  FHT:  Baseline 125 , moderate variability, accelerations present, no decelerations Contractions: q 2 mins, Mild, mostly painless   Labs: Results for orders placed or performed during the hospital encounter of 09/28/14 (from the past 24 hour(s))  Urinalysis, Routine w reflex microscopic (not at [facility])     Status: Abnormal   Collection Time: 09/28/14  3:00 PM  Result Value Ref Range   Color, Urine YELLOW YELLOW   APPearance CLEAR CLEAR   Specific Gravity, Urine 1.015 1.005 - 1.030   pH 7.0 5.0 - 8.0   Glucose, UA NEGATIVE NEGATIVE mg/dL   Hgb urine dipstick NEGATIVE NEGATIVE   Bilirubin Urine NEGATIVE NEGATIVE   Ketones, ur 15 (A) NEGATIVE mg/dL   Protein, ur NEGATIVE NEGATIVE mg/dL   Urobilinogen, UA 1.0 0.0 - 1.0 mg/dL   Nitrite NEGATIVE NEGATIVE   Leukocytes, UA NEGATIVE NEGATIVE  CBC     Status: Abnormal   Collection Time: 09/28/14  3:08 PM  Result Value Ref Range   WBC 9.0 4.0 - 10.5 K/uL   RBC 4.19 3.87 - 5.11 MIL/uL   Hemoglobin 11.7 (L) 12.0 - 15.0 g/dL   HCT 16.1 (L) 09.6 - 04.5 %   MCV 85.0 78.0 - 100.0 fL   MCH 27.9 26.0 - 34.0 pg   MCHC 32.9 30.0 - 36.0 g/dL   RDW 40.9 81.1 - 91.4 %   Platelets 185 150 - 400 K/uL  Comprehensive metabolic panel     Status:  Abnormal   Collection Time: 09/28/14  3:08 PM  Result Value Ref Range   Sodium 134 (L) 135 - 145 mmol/L   Potassium 4.3 3.5 - 5.1 mmol/L   Chloride 105 101 - 111 mmol/L   CO2 22 22 - 32 mmol/L   Glucose, Bld 81 65 - 99 mg/dL   BUN 8 6 - 20 mg/dL   Creatinine, Ser 7.82 0.44 - 1.00 mg/dL   Calcium 8.8 (L) 8.9 - 10.3 mg/dL   Total Protein 7.4 6.5 - 8.1 g/dL   Albumin 3.2 (L) 3.5 - 5.0 g/dL   AST 22 15 - 41 U/L   ALT 20 14 - 54 U/L   Alkaline Phosphatase 188 (H) 38 - 126 U/L   Total  Bilirubin 0.5 0.3 - 1.2 mg/dL   GFR calc non Af Amer >60 >60 mL/min   GFR calc Af Amer >60 >60 mL/min   Anion gap 7 5 - 15    Imaging:   N/A   MAU Course: CBC, CMET, UA, Orthostatic VS, LR bolus.  Feeling much better. No further dizziness. Not feeling contractions and declines repeat VE. Discussed Sx, exam, labs w/ Dr. Henderson Cloud. [facility]OL scheduled tomorrow [facility] at [facility].   MDM:  19 year old pregnant female with near-syncopal episode likely combination of panic attack and overeating. No orthostatic changes, dehydration, electrolyte imbalances or significant anemia. No evidence of seizure activity. Symptoms resolved spontaneously. Patient is a well-appearing and stable for discharge.  Assessment: 1. Near syncope   2. Braxton Hicks contractions     Plan: D/C home in stable condition per consult w/ Dr. Henderson Cloud.  Labor precautions and fetal kick counts. Increase fluids and rest.  Avoid stressful situations and heat.      Follow-up Information    Follow up with [facility] On 09/29/2014.   Why:  at midnight for induction of labor   Contact information:   [address] [id number] mc [place] [address] [phone]      Follow up with [facility].   Why:  As needed in emergencies   Contact information:   [address] [id number] mc [place] [address] [phone]         Medication List    TAKE these medications        acetaminophen 325 MG tablet  Commonly known as:  TYLENOL  Take 325 mg by mouth every 6 (six) hours as needed for headache.     calcium carbonate 500 MG chewable tablet  Commonly known as:  TUMS - dosed in mg elemental calcium  Chew 2 tablets by mouth 3 (three) times daily as needed for indigestion or heartburn.     Prenatal Vitamin 27-0.8 MG Tabs  Take 1 tablet by mouth daily.        Van Meter, CNM 09/28/2014 5:05 PM

## 2014-09-28 NOTE — Telephone Encounter (Signed)
Preadmission screen  

## 2014-09-28 NOTE — MAU Note (Signed)
Pt states she had a Dr. appt scheduled at 1330 with Dr. Dareen Piano.  Step mother and sister came to house.  FOB, step mother, sister and pt got in a verbal dispute ref who was taking the pt to dr appt to be induced today.  Pt denies vaginal bleeding, ROM, or abnormal discharge.  Good fetal movement.  Pt states she feels like she became over heated from being outside.

## 2014-09-28 NOTE — Progress Notes (Signed)
Called to notify of pt arrival in MAU and vaginal exam. Also discussed pt's significant other's combative behavior regarding patient and her discomfort. Will discharge patient home with labor precautions and instructions to keep appointment with Dr. Dareen Piano today. Instructed by MD to take another RN in the room when discharging patient because of concern of the combative behavior of the significant other.

## 2014-09-30 ENCOUNTER — Inpatient Hospital Stay (HOSPITAL_COMMUNITY): Admission: RE | Admit: 2014-09-30 | Payer: Medicaid Other | Source: Ambulatory Visit

## 2014-10-01 ENCOUNTER — Encounter (HOSPITAL_COMMUNITY): Payer: Self-pay | Admitting: *Deleted

## 2014-10-01 ENCOUNTER — Inpatient Hospital Stay (HOSPITAL_COMMUNITY)
Admission: AD | Admit: 2014-10-01 | Discharge: 2014-10-04 | DRG: 774 | Disposition: A | Discharge status: Home / Self Care | Payer: Medicaid Other | Source: Ambulatory Visit | Attending: Obstetrics and Gynecology | Admitting: Obstetrics and Gynecology

## 2014-10-01 DIAGNOSIS — Z3490 Encounter for supervision of normal pregnancy, unspecified, unspecified trimester: Secondary | ICD-10-CM

## 2014-10-01 DIAGNOSIS — Z3A39 39 weeks gestation of pregnancy: Secondary | ICD-10-CM | POA: Diagnosis present

## 2014-10-01 DIAGNOSIS — O9852 Other viral diseases complicating childbirth: Secondary | ICD-10-CM | POA: Diagnosis present

## 2014-10-01 LAB — CBC
HEMATOCRIT: 34 % — AB (ref 36.0–46.0)
Hemoglobin: 11.3 g/dL — ABNORMAL LOW (ref 12.0–15.0)
MCH: 28 pg (ref 26.0–34.0)
MCHC: 33.2 g/dL (ref 30.0–36.0)
MCV: 84.4 fL (ref 78.0–100.0)
Platelets: 201 10*3/uL (ref 150–400)
RBC: 4.03 MIL/uL (ref 3.87–5.11)
RDW: 13.6 % (ref 11.5–15.5)
WBC: 9 10*3/uL (ref 4.0–10.5)

## 2014-10-01 LAB — ABO/RH: ABO/RH(D): A POS

## 2014-10-01 LAB — TYPE AND SCREEN
ABO/RH(D): A POS
ANTIBODY SCREEN: NEGATIVE

## 2014-10-01 MED ORDER — LACTATED RINGERS IV SOLN
500.0000 mL | INTRAVENOUS | Status: DC | PRN
  Administered 2014-10-02: 500 mL via INTRAVENOUS

## 2014-10-01 MED ORDER — DIPHENHYDRAMINE HCL 50 MG/ML IJ SOLN
12.5000 mg | INTRAMUSCULAR | Status: DC | PRN
  Administered 2014-10-02: 12.5 mg via INTRAVENOUS
  Filled 2014-10-01: qty 1

## 2014-10-01 MED ORDER — OXYCODONE-ACETAMINOPHEN 5-325 MG PO TABS: 1.0000 | ORAL_TABLET | ORAL | Status: DC | PRN

## 2014-10-01 MED ORDER — FLEET ENEMA 7-19 GM/118ML RE ENEM: 1.0000 | ENEMA | RECTAL | Status: DC | PRN

## 2014-10-01 MED ORDER — TERBUTALINE SULFATE 1 MG/ML IJ SOLN
0.2500 mg | Freq: Once | INTRAMUSCULAR | Status: DC | PRN
  Filled 2014-10-01: qty 1

## 2014-10-01 MED ORDER — MISOPROSTOL 25 MCG QUARTER TABLET
25.0000 ug | ORAL_TABLET | ORAL | Status: DC | PRN
  Administered 2014-10-01 – 2014-10-02 (×3): 25 ug via VAGINAL
  Filled 2014-10-01: qty 1
  Filled 2014-10-01 (×3): qty 0.25

## 2014-10-01 MED ORDER — EPHEDRINE 5 MG/ML INJ
10.0000 mg | INTRAVENOUS | Status: DC | PRN
  Filled 2014-10-01: qty 2

## 2014-10-01 MED ORDER — FENTANYL 2.5 MCG/ML BUPIVACAINE 1/10 % EPIDURAL INFUSION (WH - ANES)
14.0000 mL/h | INTRAMUSCULAR | Status: DC | PRN
  Administered 2014-10-02: 14 mL/h via EPIDURAL
  Filled 2014-10-01: qty 125

## 2014-10-01 MED ORDER — LIDOCAINE HCL (PF) 1 % IJ SOLN
30.0000 mL | INTRAMUSCULAR | Status: DC | PRN
Start: 2014-10-01 — End: 2014-10-02
  Administered 2014-10-02: 30 mL via SUBCUTANEOUS
  Filled 2014-10-01: qty 30

## 2014-10-01 MED ORDER — OXYTOCIN 40 UNITS IN LACTATED RINGERS INFUSION - SIMPLE MED
62.5000 mL/h | INTRAVENOUS | Status: DC
  Filled 2014-10-01: qty 1000

## 2014-10-01 MED ORDER — OXYTOCIN BOLUS FROM INFUSION
500.0000 mL | INTRAVENOUS | Status: DC
  Administered 2014-10-02: 500 mL via INTRAVENOUS

## 2014-10-01 MED ORDER — ACETAMINOPHEN 325 MG PO TABS
650.0000 mg | ORAL_TABLET | ORAL | Status: DC | PRN
  Administered 2014-10-01 – 2014-10-02 (×2): 650 mg via ORAL
  Filled 2014-10-01 (×2): qty 2

## 2014-10-01 MED ORDER — ONDANSETRON HCL 4 MG/2ML IJ SOLN
4.0000 mg | Freq: Four times a day (QID) | INTRAMUSCULAR | Status: DC | PRN
  Administered 2014-10-02: 4 mg via INTRAVENOUS
  Filled 2014-10-01: qty 2

## 2014-10-01 MED ORDER — OXYCODONE-ACETAMINOPHEN 5-325 MG PO TABS: 2.0000 | ORAL_TABLET | ORAL | Status: DC | PRN

## 2014-10-01 MED ORDER — LACTATED RINGERS IV SOLN
INTRAVENOUS | Status: DC
  Administered 2014-10-01 – 2014-10-02 (×3): via INTRAVENOUS

## 2014-10-01 MED ORDER — PHENYLEPHRINE 40 MCG/ML (10ML) SYRINGE FOR IV PUSH (FOR BLOOD PRESSURE SUPPORT)
80.0000 ug | PREFILLED_SYRINGE | INTRAVENOUS | Status: DC | PRN
  Filled 2014-10-01: qty 20
  Filled 2014-10-01: qty 2

## 2014-10-01 MED ORDER — CITRIC ACID-SODIUM CITRATE 334-500 MG/5ML PO SOLN: 30.0000 mL | ORAL | Status: DC | PRN

## 2014-10-01 NOTE — H&P (Signed)
19 y.o. [redacted]w[redacted]d  G2P0010 comes in to office today for NST.  On NST,which was reactive, she had two late decels.  Otherwise has good fetal movement and no bleeding.  Past Medical History  Diagnosis Date  . Medical history non-contributory   . Hx of varicella     Past Surgical History  Procedure Laterality Date  . Mouth surgery    . Oral cyst removal      OB History  Gravida Para Term Preterm AB SAB TAB Ectopic Multiple Living  2 0 0 0 1 1 0 0  0    # Outcome Date GA Lbr Len/2nd Weight Sex Delivery Anes PTL Lv  2 Current           1 SAB               Social History   Social History  . Marital Status: Single    Spouse Name: N/A  . Number of Children: N/A  . Years of Education: N/A   Occupational History  . Not on file.   Social History Main Topics  . Smoking status: Never Smoker   . Smokeless tobacco: Never Used  . Alcohol Use: No  . Drug Use: No  . Sexual Activity: Yes    Birth Control/ Protection: None   Other Topics Concern  . Not on file   Social History Narrative   ** Merged History Encounter **       Amoxicillin; Penicillins; and Latex    Prenatal Transfer Tool  Maternal Diabetes: No Genetic Screening: Normal Maternal Ultrasounds/Referrals: Normal Fetal Ultrasounds or other Referrals:  None Maternal Substance Abuse:  No Significant Maternal Medications:  None Significant Maternal Lab Results: None  Other PNC: uncomplicated.    Filed Vitals:   10/01/14 1727  Temp: 98.1 F (36.7 C)  Resp: 16     Lungs/Cor:  NAD Abdomen:  soft, gravid Ex:  no cords, erythema SVE:  1/50/-2 FHTs:  Now 130s, good STV, NST R Toco:  q 1-2 minutes   A/P   TErm with late decels on NST in office. Will induce.  To often contractions for cytotec- proceed to pit.  GBS neg.  HORVATH,MICHELLE A

## 2014-10-02 ENCOUNTER — Inpatient Hospital Stay (HOSPITAL_COMMUNITY): Payer: Medicaid Other | Admitting: Anesthesiology

## 2014-10-02 ENCOUNTER — Encounter (HOSPITAL_COMMUNITY): Payer: Self-pay | Admitting: Family Medicine

## 2014-10-02 LAB — RPR: RPR Ser Ql: NONREACTIVE

## 2014-10-02 MED ORDER — IBUPROFEN 800 MG PO TABS
800.0000 mg | ORAL_TABLET | Freq: Three times a day (TID) | ORAL | Status: DC
  Administered 2014-10-02 – 2014-10-04 (×6): 800 mg via ORAL
  Filled 2014-10-02 (×6): qty 1

## 2014-10-02 MED ORDER — WITCH HAZEL-GLYCERIN EX PADS: 1.0000 "application " | MEDICATED_PAD | CUTANEOUS | Status: DC | PRN

## 2014-10-02 MED ORDER — DIPHENHYDRAMINE HCL 25 MG PO CAPS: 25.0000 mg | ORAL_CAPSULE | Freq: Four times a day (QID) | ORAL | Status: DC | PRN

## 2014-10-02 MED ORDER — MEASLES, MUMPS & RUBELLA VAC ~~LOC~~ INJ
0.5000 mL | INJECTION | Freq: Once | SUBCUTANEOUS | Status: DC
  Filled 2014-10-02: qty 0.5

## 2014-10-02 MED ORDER — ONDANSETRON HCL 4 MG/2ML IJ SOLN: 4.0000 mg | INTRAMUSCULAR | Status: DC | PRN

## 2014-10-02 MED ORDER — PRENATAL MULTIVITAMIN CH
1.0000 | ORAL_TABLET | Freq: Every day | ORAL | Status: DC
  Administered 2014-10-03: 1 via ORAL
  Filled 2014-10-02: qty 1

## 2014-10-02 MED ORDER — METHYLERGONOVINE MALEATE 0.2 MG/ML IJ SOLN: 0.2000 mg | INTRAMUSCULAR | Status: DC | PRN

## 2014-10-02 MED ORDER — TETANUS-DIPHTH-ACELL PERTUSSIS 5-2.5-18.5 LF-MCG/0.5 IM SUSP: 0.5000 mL | Freq: Once | INTRAMUSCULAR | Status: DC

## 2014-10-02 MED ORDER — OXYCODONE-ACETAMINOPHEN 5-325 MG PO TABS
2.0000 | ORAL_TABLET | ORAL | Status: DC | PRN
  Administered 2014-10-03 – 2014-10-04 (×5): 2 via ORAL
  Filled 2014-10-02 (×5): qty 2

## 2014-10-02 MED ORDER — SODIUM CHLORIDE 0.9 % IJ SOLN: 3.0000 mL | Freq: Two times a day (BID) | INTRAMUSCULAR | Status: DC

## 2014-10-02 MED ORDER — BUPIVACAINE HCL (PF) 0.25 % IJ SOLN
INTRAMUSCULAR | Status: DC | PRN
  Administered 2014-10-02: 3 mL via EPIDURAL

## 2014-10-02 MED ORDER — ZOLPIDEM TARTRATE 5 MG PO TABS: 5.0000 mg | ORAL_TABLET | Freq: Every evening | ORAL | Status: DC | PRN

## 2014-10-02 MED ORDER — INFLUENZA VAC SPLIT QUAD 0.5 ML IM SUSY
0.5000 mL | PREFILLED_SYRINGE | INTRAMUSCULAR | Status: DC
  Filled 2014-10-02: qty 0.5

## 2014-10-02 MED ORDER — TERBUTALINE SULFATE 1 MG/ML IJ SOLN
0.2500 mg | Freq: Once | INTRAMUSCULAR | Status: DC | PRN
  Filled 2014-10-02: qty 1

## 2014-10-02 MED ORDER — SIMETHICONE 80 MG PO CHEW: 80.0000 mg | CHEWABLE_TABLET | ORAL | Status: DC | PRN

## 2014-10-02 MED ORDER — SENNOSIDES-DOCUSATE SODIUM 8.6-50 MG PO TABS
2.0000 | ORAL_TABLET | ORAL | Status: DC
  Administered 2014-10-02 – 2014-10-03 (×2): 2 via ORAL
  Filled 2014-10-02 (×2): qty 2

## 2014-10-02 MED ORDER — HYDROCORTISONE 1 % EX CREA
1.0000 "application " | TOPICAL_CREAM | Freq: Two times a day (BID) | CUTANEOUS | Status: DC | PRN
  Administered 2014-10-02: 1 via TOPICAL
  Filled 2014-10-02: qty 28

## 2014-10-02 MED ORDER — OXYTOCIN 40 UNITS IN LACTATED RINGERS INFUSION - SIMPLE MED
1.0000 m[IU]/min | INTRAVENOUS | Status: DC
  Administered 2014-10-02: 2 m[IU]/min via INTRAVENOUS

## 2014-10-02 MED ORDER — SODIUM CHLORIDE 0.9 % IV SOLN: 250.0000 mL | INTRAVENOUS | Status: DC | PRN

## 2014-10-02 MED ORDER — SODIUM CHLORIDE 0.9 % IJ SOLN: 3.0000 mL | INTRAMUSCULAR | Status: DC | PRN

## 2014-10-02 MED ORDER — FERROUS SULFATE 325 (65 FE) MG PO TABS
325.0000 mg | ORAL_TABLET | Freq: Two times a day (BID) | ORAL | Status: DC
  Administered 2014-10-03 – 2014-10-04 (×3): 325 mg via ORAL
  Filled 2014-10-02 (×3): qty 1

## 2014-10-02 MED ORDER — DIBUCAINE 1 % RE OINT: 1.0000 "application " | TOPICAL_OINTMENT | RECTAL | Status: DC | PRN

## 2014-10-02 MED ORDER — BUTORPHANOL TARTRATE 1 MG/ML IJ SOLN
INTRAMUSCULAR | Status: AC
  Filled 2014-10-02: qty 1

## 2014-10-02 MED ORDER — BENZOCAINE-MENTHOL 20-0.5 % EX AERO
1.0000 "application " | INHALATION_SPRAY | CUTANEOUS | Status: DC | PRN
  Administered 2014-10-02: 1 via TOPICAL
  Filled 2014-10-02: qty 56

## 2014-10-02 MED ORDER — ONDANSETRON HCL 4 MG PO TABS
4.0000 mg | ORAL_TABLET | ORAL | Status: DC | PRN
  Administered 2014-10-03: 4 mg via ORAL
  Filled 2014-10-02: qty 1

## 2014-10-02 MED ORDER — OXYCODONE-ACETAMINOPHEN 5-325 MG PO TABS
1.0000 | ORAL_TABLET | ORAL | Status: DC | PRN
  Administered 2014-10-02 – 2014-10-03 (×4): 1 via ORAL
  Filled 2014-10-02 (×4): qty 1

## 2014-10-02 MED ORDER — MAGNESIUM HYDROXIDE 400 MG/5ML PO SUSP: 30.0000 mL | ORAL | Status: DC | PRN

## 2014-10-02 MED ORDER — BUTORPHANOL TARTRATE 1 MG/ML IJ SOLN
1.0000 mg | INTRAMUSCULAR | Status: DC | PRN
  Administered 2014-10-02 (×2): 1 mg via INTRAVENOUS
  Filled 2014-10-02: qty 1

## 2014-10-02 MED ORDER — METHYLERGONOVINE MALEATE 0.2 MG PO TABS: 0.2000 mg | ORAL_TABLET | ORAL | Status: DC | PRN

## 2014-10-02 MED ORDER — LANOLIN HYDROUS EX OINT: TOPICAL_OINTMENT | CUTANEOUS | Status: DC | PRN

## 2014-10-02 MED ORDER — LIDOCAINE HCL (PF) 1 % IJ SOLN
INTRAMUSCULAR | Status: DC | PRN
  Administered 2014-10-02: 6 mL via EPIDURAL
  Administered 2014-10-02: 4 mL

## 2014-10-02 MED ORDER — FENTANYL 2.5 MCG/ML BUPIVACAINE 1/10 % EPIDURAL INFUSION (WH - ANES): 14.0000 mL/h | INTRAMUSCULAR | Status: DC | PRN

## 2014-10-02 MED ORDER — ACETAMINOPHEN 325 MG PO TABS: 650.0000 mg | ORAL_TABLET | ORAL | Status: DC | PRN

## 2014-10-02 MED ORDER — LIDOCAINE-EPINEPHRINE (PF) 2 %-1:200000 IJ SOLN
INTRAMUSCULAR | Status: DC | PRN
  Administered 2014-10-02: 5 mL via EPIDURAL

## 2014-10-02 NOTE — Anesthesia Preprocedure Evaluation (Signed)

## 2014-10-02 NOTE — Lactation Note (Signed)
This note was copied from the chart of Rose Gibbs. Lactation Consultation Note  Initial Consultation with mom and dad. Infant was nursing on football hold to right breast. Moms RN assisted mom in latching infant. Infant was nursing with rhythmic sucking and audible intermittent swallows heard. Infant has previously BF x 3 for 5-15 minutes and has had 1 void and 1 stool. Mom was stimulating infant to keep him nursing during feeding. Infant would easily begin nursing as mom encouraged him. Enc. Mom to feed 8-12 x in 24 hours at first feeding cues. Enc. Mom to use pillows to position at level of breast for feeding and to massage/compress breasts during feeding. BF basics reviewed. Referred parents to BF information pages in Taking Care of Baby and Me. [facility] Brochure and phone # given to mom. Informed parents of BF Support Groups, BF Resources, and OP services. Enc. Family to call prn questions/concerns. Enc. Family to keep I/O log in hospital and after D/C. Mom is active with WIC. Moms nurse taught her to hand express and gave her a hand pump.   Patient Name: Rose Gibbs [id number] Date: 10/02/2014 Reason for consult: Initial assessment   Maternal Data Formula Feeding for Exclusion: No Has patient been taught Hand Expression?: Yes Does the patient have breastfeeding experience prior to this delivery?: No  Feeding Feeding Type: Breast Fed Length of feed: 6 min  LATCH Score/Interventions Latch: Too sleepy or reluctant, no latch achieved, no sucking elicited.  Audible Swallowing: A few with stimulation  Type of Nipple: Everted at rest and after stimulation  Comfort (Breast/Nipple): Soft / non-tender     Hold (Positioning): Full assist, staff holds infant at breast Intervention(s):  (pillows/rows under the breast)  LATCH Score: 5  Lactation Tools Discussed/Used WIC Program: Yes   Consult Status Consult Status: Follow-up Date: 10/03/14 Follow-up type: In-patient    Rose Gibbs 10/02/2014, 10:42 PM

## 2014-10-02 NOTE — Anesthesia Procedure Notes (Signed)

## 2014-10-02 NOTE — Progress Notes (Signed)
Pt comfortable and got 3 doses of cytotec last night.  FHTs 110, gSTV, NST R, no decels  Toco irregular.  SVE 2/50/-2  Start pitocin.

## 2014-10-03 LAB — CBC
HEMATOCRIT: 26 % — AB (ref 36.0–46.0)
HEMOGLOBIN: 8.5 g/dL — AB (ref 12.0–15.0)
MCH: 28 pg (ref 26.0–34.0)
MCHC: 32.7 g/dL (ref 30.0–36.0)
MCV: 85.5 fL (ref 78.0–100.0)
Platelets: 144 10*3/uL — ABNORMAL LOW (ref 150–400)
RBC: 3.04 MIL/uL — ABNORMAL LOW (ref 3.87–5.11)
RDW: 13.8 % (ref 11.5–15.5)
WBC: 11.2 10*3/uL — ABNORMAL HIGH (ref 4.0–10.5)

## 2014-10-03 NOTE — Progress Notes (Signed)
Patient is eating, ambulating, voiding.  Pain control is good.  Filed Vitals:   10/02/14 1715 10/02/14 1815 10/02/14 2200 10/02/14 2352  BP: 133/87 136/86 123/68 118/62  Pulse: 82 90 78 83  Temp: 98.2 F (36.8 C) 98.6 F (37 C) 98.6 F (37 C) 97.9 F (36.6 C)  TempSrc: Oral Oral Oral Oral  Resp: Height:      Weight:      SpO2:        Fundus firm Perineum without swelling.  Lab Results  Component Value Date   WBC 9.0 10/01/2014   HGB 11.3* 10/01/2014   HCT 34.0* 10/01/2014   MCV 84.4 10/01/2014   PLT 201 10/01/2014    --/--/A POS, A POS (09/23 1720)/RI  A/P Post partum day 1.  Routine care.  Expect d/c routine.   Parents desire circ in office. HORVATH,MICHELLE A

## 2014-10-03 NOTE — Lactation Note (Signed)
This note was copied from the chart of Rose Aliciana Ricciardi. Lactation Consultation Note  Follow up consultation with parents. Infant has had 3 BF, 5 attempts, 4 voids and 3 stools in last 24 hours. Mom reports her breast are fuller today and are warm to touch. Mom reports that she is having trouble latching infant unassisted. Infant was crying after having pictures made. Assisted mom in latching infant to right breast in football hold, infant would open wide but not latch. He was still crying.  Infant consoled and placed in cross cradle hold on right breast. He latched immediately with vigorous sucking and few intermittent swallows. Encouraged mom to massage/compress breast with feeding. Mom reports no pain or pinching. Enc mom to call for assistance as needed.  Patient Name: Rose Gibbs [id number] Date: 10/03/2014 Reason for consult: Follow-up assessment   Maternal Data Has patient been taught Hand Expression?: Yes Does the patient have breastfeeding experience prior to this delivery?: No  Feeding Feeding Type: Breast Fed Length of feed: 14 min  LATCH Score/Interventions Latch: Repeated attempts needed to sustain latch, nipple held in mouth throughout feeding, stimulation needed to elicit sucking reflex. Intervention(s): Skin to skin Intervention(s): Adjust position;Assist with latch;Breast massage;Breast compression  Audible Swallowing: Spontaneous and intermittent Intervention(s): Skin to skin Intervention(s): Skin to skin  Type of Nipple: Flat  Comfort (Breast/Nipple): Soft / non-tender     Hold (Positioning): Assistance needed to correctly position infant at breast and maintain latch. Intervention(s): Breastfeeding basics reviewed;Support Pillows;Position options;Skin to skin  LATCH Score: 7  Lactation Tools Discussed/Used     Consult Status Consult Status: Follow-up Date: 10/04/14 Follow-up type: In-patient    Silas Flood Breawna Montenegro 10/03/2014, 6:22 PM

## 2014-10-03 NOTE — Anesthesia Postprocedure Evaluation (Signed)
Anesthesia Post Note  Patient: Rose Gibbs  Procedure(s) Performed: * No procedures listed *  Anesthesia type: Epidural  Patient location: Mother/Baby  Post pain: Pain level controlled  Post assessment: Post-op Vital signs reviewed  Last Vitals: BP 110/54 mmHg  Pulse 71  Temp(Src) 36.7 C (Oral)  Resp 18  Ht  (1.6 m)  Wt 146 lb (66.225 kg)  BMI 25.87 kg/m2  SpO2 100%  LMP 12/29/2013  Breastfeeding? Unknown  Post vital signs: Reviewed  Level of consciousness: awake  Complications: No apparent anesthesia complications

## 2014-10-04 MED ORDER — OXYCODONE-ACETAMINOPHEN 5-325 MG PO TABS: 2.0000 | ORAL_TABLET | ORAL | Status: DC | PRN

## 2014-10-04 MED ORDER — IBUPROFEN 600 MG PO TABS: 600.0000 mg | ORAL_TABLET | Freq: Four times a day (QID) | ORAL | Status: DC | PRN

## 2014-10-04 MED ORDER — DOCUSATE SODIUM 100 MG PO CAPS: 100.0000 mg | ORAL_CAPSULE | Freq: Two times a day (BID) | ORAL | Status: DC

## 2014-10-04 NOTE — Discharge Summary (Signed)
Obstetric Discharge Summary Reason for Admission: onset of labor Prenatal Procedures: ultrasound Intrapartum Procedures: spontaneous vaginal delivery Postpartum Procedures: none Complications-Operative and Postpartum: 1st degree perineal laceration HEMOGLOBIN  Date Value Ref Range Status  10/03/2014 8.5* 12.0 - 15.0 g/dL Final    Comment:    REPEATED TO VERIFY DELTA CHECK NOTED    HCT  Date Value Ref Range Status  10/03/2014 26.0* 36.0 - 46.0 % Final    Physical Exam:  General: alert, cooperative and appears stated age 19: appropriate Uterine Fundus: firm  Discharge Diagnoses: Term Pregnancy-delivered  Discharge Information: Date: 10/04/2014 Activity: pelvic rest Diet: routine Medications: Ibuprofen, Colace and Percocet Condition: improved Instructions: refer to practice specific booklet Discharge to: home Follow-up Information    Follow up with HORVATH,MICHELLE A, MD In 4 weeks.   Specialty:  Obstetrics and Gynecology   Why:  For a postpartum evaluation   Contact information:   [address] [place] [address] [phone]       Newborn Data: Live born female  Birth Weight: 7 lb 0.4 oz (3185 g) APGAR: 9, 9  Home with mother.  Rose Gibbs,Rose Gibbs. 10/04/2014, 8:25 AM

## 2014-10-04 NOTE — Progress Notes (Signed)
CSW acknowledges re-consult for patient needing assistance obtaining a car seat.   CSW unable to assist with this need. CSW consulted with RN, who will follow up with [facility] if family continues to require assistance with obtaining a car seat prior to discharge.

## 2014-10-04 NOTE — Discharge Summary (Deleted)
Obstetric Discharge Summary Reason for Admission: onset of labor Prenatal Procedures: ultrasound Intrapartum Procedures: spontaneous vaginal delivery Postpartum Procedures: none Complications-Operative and Postpartum: 1st degree perineal laceration HEMOGLOBIN  Date Value Ref Range Status  10/03/2014 8.5* 12.0 - 15.0 g/dL Final    Comment:    REPEATED TO VERIFY DELTA CHECK NOTED    HCT  Date Value Ref Range Status  10/03/2014 26.0* 36.0 - 46.0 % Final    Physical Exam:  General: alert, cooperative and appears stated age 4: appropriate Uterine Fundus: firm  Discharge Diagnoses: Term Pregnancy-delivered  Discharge Information: Date: 10/04/2014 Activity: pelvic rest Diet: routine Medications: Ibuprofen, Colace and Percocet Condition: improved Instructions: refer to practice specific booklet Discharge to: home Follow-up Information    Follow up with HORVATH,MICHELLE A, MD In 4 weeks.   Specialty:  Obstetrics and Gynecology   Why:  For a postpartum evaluation   Contact information:   [address] [place] [address] [phone]       Follow up with HORVATH,MICHELLE A, MD In 4 weeks.   Specialty:  Obstetrics and Gynecology   Why:  For a postpartum evaluation   Contact information:   [address] Dorothy[address] [place] [address] [phone]       Newborn Data: Live born female  Birth Weight: 7 lb 0.4 oz (3185 g) APGAR: 9, 9  Home with mother.  ROSS,KENDRA H. 10/04/2014, 9:20 AM

## 2014-11-16 ENCOUNTER — Encounter (HOSPITAL_COMMUNITY): Payer: Self-pay | Admitting: Emergency Medicine

## 2014-11-16 ENCOUNTER — Emergency Department (HOSPITAL_COMMUNITY)
Admission: EM | Admit: 2014-11-16 | Discharge: 2014-11-16 | Disposition: A | Discharge status: Home / Self Care | Payer: Medicaid Other | Source: Home / Self Care | Attending: Emergency Medicine | Admitting: Emergency Medicine

## 2014-11-16 ENCOUNTER — Emergency Department (HOSPITAL_COMMUNITY): Payer: Medicaid Other

## 2014-11-16 ENCOUNTER — Emergency Department (HOSPITAL_COMMUNITY)
Admission: EM | Admit: 2014-11-16 | Discharge: 2014-11-16 | Disposition: A | Discharge status: Home / Self Care | Payer: Medicaid Other | Attending: Emergency Medicine | Admitting: Emergency Medicine

## 2014-11-16 ENCOUNTER — Other Ambulatory Visit: Payer: Self-pay

## 2014-11-16 DIAGNOSIS — F1510 Other stimulant abuse, uncomplicated: Secondary | ICD-10-CM | POA: Insufficient documentation

## 2014-11-16 DIAGNOSIS — R109 Unspecified abdominal pain: Secondary | ICD-10-CM | POA: Insufficient documentation

## 2014-11-16 DIAGNOSIS — Z91040 Latex allergy status: Secondary | ICD-10-CM | POA: Insufficient documentation

## 2014-11-16 DIAGNOSIS — Z8619 Personal history of other infectious and parasitic diseases: Secondary | ICD-10-CM | POA: Diagnosis not present

## 2014-11-16 DIAGNOSIS — R079 Chest pain, unspecified: Secondary | ICD-10-CM | POA: Insufficient documentation

## 2014-11-16 DIAGNOSIS — Z880 Allergy status to penicillin: Secondary | ICD-10-CM | POA: Diagnosis not present

## 2014-11-16 DIAGNOSIS — R0602 Shortness of breath: Secondary | ICD-10-CM | POA: Diagnosis not present

## 2014-11-16 DIAGNOSIS — Z79899 Other long term (current) drug therapy: Secondary | ICD-10-CM | POA: Diagnosis not present

## 2014-11-16 DIAGNOSIS — R55 Syncope and collapse: Secondary | ICD-10-CM | POA: Diagnosis present

## 2014-11-16 DIAGNOSIS — F1210 Cannabis abuse, uncomplicated: Secondary | ICD-10-CM | POA: Insufficient documentation

## 2014-11-16 DIAGNOSIS — E876 Hypokalemia: Secondary | ICD-10-CM | POA: Diagnosis not present

## 2014-11-16 DIAGNOSIS — Z3202 Encounter for pregnancy test, result negative: Secondary | ICD-10-CM | POA: Diagnosis not present

## 2014-11-16 DIAGNOSIS — F1910 Other psychoactive substance abuse, uncomplicated: Secondary | ICD-10-CM

## 2014-11-16 DIAGNOSIS — Z9119 Patient's noncompliance with other medical treatment and regimen: Secondary | ICD-10-CM | POA: Insufficient documentation

## 2014-11-16 LAB — CBC
HEMATOCRIT: 35.5 % — AB (ref 36.0–46.0)
Hemoglobin: 11.3 g/dL — ABNORMAL LOW (ref 12.0–15.0)
MCH: 26.8 pg (ref 26.0–34.0)
MCHC: 31.8 g/dL (ref 30.0–36.0)
MCV: 84.3 fL (ref 78.0–100.0)
Platelets: 276 10*3/uL (ref 150–400)
RBC: 4.21 MIL/uL (ref 3.87–5.11)
RDW: 14 % (ref 11.5–15.5)
WBC: 8 10*3/uL (ref 4.0–10.5)

## 2014-11-16 LAB — RAPID URINE DRUG SCREEN, HOSP PERFORMED
AMPHETAMINES: POSITIVE — AB
BARBITURATES: NOT DETECTED
BENZODIAZEPINES: NOT DETECTED
COCAINE: NOT DETECTED
Opiates: NOT DETECTED
Tetrahydrocannabinol: POSITIVE — AB

## 2014-11-16 LAB — URINALYSIS, ROUTINE W REFLEX MICROSCOPIC
Bilirubin Urine: NEGATIVE
GLUCOSE, UA: NEGATIVE mg/dL
KETONES UR: NEGATIVE mg/dL
LEUKOCYTES UA: NEGATIVE
Nitrite: NEGATIVE
PH: 6 (ref 5.0–8.0)
Protein, ur: 30 mg/dL — AB
Specific Gravity, Urine: 1.034 — ABNORMAL HIGH (ref 1.005–1.030)
Urobilinogen, UA: 1 mg/dL (ref 0.0–1.0)

## 2014-11-16 LAB — BASIC METABOLIC PANEL
Anion gap: 8 (ref 5–15)
BUN: 7 mg/dL (ref 6–20)
CO2: 24 mmol/L (ref 22–32)
Calcium: 8.8 mg/dL — ABNORMAL LOW (ref 8.9–10.3)
Chloride: 107 mmol/L (ref 101–111)
Creatinine, Ser: 0.64 mg/dL (ref 0.44–1.00)
GFR calc Af Amer: >60 mL/min (ref >60)
GLUCOSE: 88 mg/dL (ref 65–99)
POTASSIUM: 2.9 mmol/L — AB (ref 3.5–5.1)
Sodium: 139 mmol/L (ref 135–145)

## 2014-11-16 LAB — URINE MICROSCOPIC-ADD ON

## 2014-11-16 LAB — ETHANOL: Alcohol, Ethyl (B): <5 mg/dL (ref <5)

## 2014-11-16 LAB — POC URINE PREG, ED: Preg Test, Ur: NEGATIVE

## 2014-11-16 LAB — I-STAT TROPONIN, ED: TROPONIN I, POC: 0 ng/mL (ref 0.00–0.08)

## 2014-11-16 LAB — SALICYLATE LEVEL

## 2014-11-16 LAB — D-DIMER, QUANTITATIVE: D-Dimer, Quant: 0.37 ug/mL-FEU (ref 0.00–0.48)

## 2014-11-16 LAB — ACETAMINOPHEN LEVEL: Acetaminophen (Tylenol), Serum: <10 ug/mL — ABNORMAL LOW (ref 10–30)

## 2014-11-16 MED ORDER — POTASSIUM CHLORIDE CRYS ER 20 MEQ PO TBCR
80.0000 meq | EXTENDED_RELEASE_TABLET | Freq: Once | ORAL | Status: AC
  Administered 2014-11-16: 80 meq via ORAL
  Filled 2014-11-16: qty 4

## 2014-11-16 MED ORDER — KETOROLAC TROMETHAMINE 30 MG/ML IJ SOLN
30.0000 mg | Freq: Once | INTRAMUSCULAR | Status: AC
  Administered 2014-11-16: 30 mg via INTRAVENOUS
  Filled 2014-11-16: qty 1

## 2014-11-16 MED ORDER — GI COCKTAIL ~~LOC~~
30.0000 mL | Freq: Once | ORAL | Status: AC
  Administered 2014-11-16: 30 mL via ORAL
  Filled 2014-11-16: qty 30

## 2014-11-16 MED ORDER — POTASSIUM CHLORIDE ER 10 MEQ PO TBCR: 20.0000 meq | EXTENDED_RELEASE_TABLET | Freq: Every day | ORAL | Status: DC

## 2014-11-16 NOTE — ED Notes (Signed)
Pt. arrived with EMS from home reports syncopal episode this morning , denies pain , alert and oriented at arrival / respirations unlabored , pt. states mid abdominal pain , pt. was here but left prior to bed assignment at triage.

## 2014-11-16 NOTE — ED Notes (Signed)
Pt is currently breastfeeding her baby.

## 2014-11-16 NOTE — ED Notes (Signed)
Pt. reports central chest pain with SOB and nausea onset 2 days ago , denies nausea or diaphoresis , no cough or congestion .

## 2014-11-16 NOTE — ED Notes (Signed)
Patient left department. Did not answer multiple calls.

## 2014-11-16 NOTE — ED Notes (Signed)
Per Radiology tech, patient refused x-ray at this time and would like pregnancy test prior.

## 2014-11-16 NOTE — ED Provider Notes (Addendum)
CSN: [id number]     Arrival date & time 11/16/14  0359 History   First MD Initiated Contact with Patient 11/16/14 0424     Chief Complaint  Patient presents with  . Loss of Consciousness     (Consider location/radiation/quality/duration/timing/severity/associated sxs/prior Treatment) Patient is a 19 y.o. female presenting with syncope. The history is provided by the patient.  Loss of Consciousness Episode history:  Single Most recent episode:  Today Timing:  Rare Progression:  Resolved Chronicity:  New Context: not urination   Witnessed: no   Relieved by:  Nothing Worsened by:  Nothing tried Ineffective treatments:  None tried Associated symptoms: chest pain   Associated symptoms: no diaphoresis, no dizziness, no focal sensory loss, no nausea, no palpitations, no recent fall, no seizures, no shortness of breath, no vomiting and no weakness   Associated symptoms comment:  Abdominal pain and CP for several days  Risk factors: no congenital heart disease     Past Medical History  Diagnosis Date  . Medical history non-contributory   . Hx of varicella    Past Surgical History  Procedure Laterality Date  . Mouth surgery    . Oral cyst removal     History reviewed. No pertinent family history. Social History  Substance Use Topics  . Smoking status: Never Smoker   . Smokeless tobacco: Never Used  . Alcohol Use: No   OB History    Gravida Para Term Preterm AB TAB SAB Ectopic Multiple Living   0 1 0 1 0 0 1     Review of Systems  Constitutional: Negative for diaphoresis.  Respiratory: Negative for cough and shortness of breath.   Cardiovascular: Positive for chest pain and syncope. Negative for palpitations and leg swelling.  Gastrointestinal: Negative for nausea, vomiting, constipation and blood in stool.  Genitourinary: Negative for dysuria.  Neurological: Negative for dizziness, seizures, speech difficulty and weakness.  All other systems reviewed and are  negative.     Allergies  Amoxicillin; Penicillins; and Latex  Home Medications   Prior to Admission medications   Medication Sig Start Date End Date Taking? Authorizing Provider  docusate sodium (COLACE) 100 MG capsule Take 1 capsule (100 mg total) by mouth 2 (two) times daily. 10/04/14   Waynard Reeds, MD  ibuprofen (ADVIL,MOTRIN) 600 MG tablet Take 1 tablet (600 mg total) by mouth every 6 (six) hours as needed. 10/04/14   Waynard Reeds, MD  oxyCODONE-acetaminophen (ROXICET) 5-325 MG per tablet Take 2 tablets by mouth every 4 (four) hours as needed. May take 1-2 tablets every 4-6 hours as needed for pain 10/04/14   Waynard Reeds, MD  Prenatal Vit-Fe Fumarate-FA (PRENATAL VITAMIN) 27-0.8 MG TABS Take 1 tablet by mouth daily. 02/19/14   Hayden Rasmussen, NP   BP 110/72 mmHg  Pulse 64  Temp(Src) 98.2 F (36.8 C)  Resp 17  SpO2 100% Physical Exam  Constitutional: She is oriented to person, place, and time. She appears well-developed and well-nourished. No distress.  Sleeping soundly but arouses easily to verbal stimuli  HENT:  Head: Normocephalic and atraumatic.  Mouth/Throat: Oropharynx is clear and moist.  Eyes: Conjunctivae and EOM are normal. Pupils are equal, round, and reactive to light.  Neck: Normal range of motion. Neck supple.  Cardiovascular: Normal rate, regular rhythm and intact distal pulses.   Pulmonary/Chest: Effort normal and breath sounds normal. She has no wheezes. She has no rales.  Abdominal: Soft. Bowel sounds are normal. She exhibits no mass. There is  no tenderness. There is no rebound and no guarding.  Musculoskeletal: Normal range of motion. She exhibits no edema.  Neurological: She is alert and oriented to person, place, and time. She has normal reflexes.  Skin: Skin is warm and dry.  Psychiatric: She has a normal mood and affect.    ED Course  Procedures (including critical care time) Labs Review Labs Reviewed  ACETAMINOPHEN LEVEL  SALICYLATE LEVEL  ETHANOL   URINE RAPID DRUG SCREEN, HOSP PERFORMED  URINALYSIS, ROUTINE W REFLEX MICROSCOPIC (NOT AT [facility])  D-DIMER, QUANTITATIVE (NOT AT [facility])    Imaging Review Dg Chest 2 View  11/16/2014  CLINICAL DATA:  Mid chest pain for 4 days. EXAM: CHEST  2 VIEW COMPARISON:  None. FINDINGS: The cardiomediastinal contours are normal. Central bronchial thickening. Pulmonary vasculature is normal. No consolidation, pleural effusion, or pneumothorax. Moderate scoliotic curvature of the lower thoracic and upper lumbar spine. No acute osseous abnormalities are seen. IMPRESSION: Central bronchial thickening, may reflect bronchitis or asthma. No focal abnormality. Thoracolumbar scoliosis. Electronically Signed   By: Rubye OaksMelanie  Ehinger M.D.   On: 11/16/2014 02:41   I have personally reviewed and evaluated these images and lab results as part of my medical decision-making.   EKG Interpretation None       MDM   Final diagnoses:  None    EKG Interpretation  Date/Time:  Tuesday November 16 2014 04:16:23 EST Ventricular Rate:  68 PR Interval:  156 QRS Duration: 98 QT Interval:  453 QTC Calculation: 482 R Axis:   99 Text Interpretation:  Sinus rhythm Borderline right axis deviation Confirmed by [facility]  MD, Shataya Winkles ([id number]) on 11/16/2014 8:35:45 AM       Negative troponin and DDimer.  Sleeping soundly pain in abdomen is with movement I suspect this is MSK pain as patient is doing a lot of lifting of an infant. Multiple substances on board.  Stop amphetamines and marijuana.   Follow up with your PMD for ongoing care.      Cy BlamerApril Nasario Czerniak, MD 11/16/14 [id number]  Zion Lint, MD 11/16/14 [phone]

## 2014-11-16 NOTE — Discharge Instructions (Signed)

## 2014-11-18 LAB — URINE CULTURE

## 2014-11-28 ENCOUNTER — Emergency Department (HOSPITAL_COMMUNITY)
Admission: EM | Admit: 2014-11-28 | Discharge: 2014-11-28 | Disposition: A | Discharge status: Home / Self Care | Payer: Medicaid Other | Attending: Emergency Medicine | Admitting: Emergency Medicine

## 2014-11-28 ENCOUNTER — Emergency Department (HOSPITAL_COMMUNITY): Payer: Medicaid Other

## 2014-11-28 ENCOUNTER — Other Ambulatory Visit: Payer: Self-pay

## 2014-11-28 ENCOUNTER — Encounter (HOSPITAL_COMMUNITY): Payer: Self-pay

## 2014-11-28 DIAGNOSIS — R102 Pelvic and perineal pain: Secondary | ICD-10-CM | POA: Insufficient documentation

## 2014-11-28 DIAGNOSIS — Z9119 Patient's noncompliance with other medical treatment and regimen: Secondary | ICD-10-CM | POA: Diagnosis not present

## 2014-11-28 DIAGNOSIS — F1210 Cannabis abuse, uncomplicated: Secondary | ICD-10-CM | POA: Diagnosis not present

## 2014-11-28 DIAGNOSIS — R000 Tachycardia, unspecified: Secondary | ICD-10-CM | POA: Diagnosis not present

## 2014-11-28 DIAGNOSIS — R1031 Right lower quadrant pain: Secondary | ICD-10-CM | POA: Diagnosis not present

## 2014-11-28 DIAGNOSIS — F17210 Nicotine dependence, cigarettes, uncomplicated: Secondary | ICD-10-CM | POA: Insufficient documentation

## 2014-11-28 DIAGNOSIS — Z8619 Personal history of other infectious and parasitic diseases: Secondary | ICD-10-CM | POA: Insufficient documentation

## 2014-11-28 DIAGNOSIS — R0789 Other chest pain: Secondary | ICD-10-CM | POA: Insufficient documentation

## 2014-11-28 DIAGNOSIS — R079 Chest pain, unspecified: Secondary | ICD-10-CM

## 2014-11-28 DIAGNOSIS — Z880 Allergy status to penicillin: Secondary | ICD-10-CM | POA: Insufficient documentation

## 2014-11-28 DIAGNOSIS — Z79899 Other long term (current) drug therapy: Secondary | ICD-10-CM | POA: Insufficient documentation

## 2014-11-28 DIAGNOSIS — N898 Other specified noninflammatory disorders of vagina: Secondary | ICD-10-CM | POA: Insufficient documentation

## 2014-11-28 DIAGNOSIS — Z91040 Latex allergy status: Secondary | ICD-10-CM | POA: Insufficient documentation

## 2014-11-28 DIAGNOSIS — Z3202 Encounter for pregnancy test, result negative: Secondary | ICD-10-CM | POA: Diagnosis not present

## 2014-11-28 LAB — BASIC METABOLIC PANEL
Anion gap: 11 (ref 5–15)
BUN: 9 mg/dL (ref 6–20)
CALCIUM: 9.4 mg/dL (ref 8.9–10.3)
CO2: 25 mmol/L (ref 22–32)
CREATININE: 0.74 mg/dL (ref 0.44–1.00)
Chloride: 103 mmol/L (ref 101–111)
GFR calc non Af Amer: >60 mL/min (ref >60)
Glucose, Bld: 108 mg/dL — ABNORMAL HIGH (ref 65–99)
Potassium: 3.2 mmol/L — ABNORMAL LOW (ref 3.5–5.1)
SODIUM: 139 mmol/L (ref 135–145)

## 2014-11-28 LAB — I-STAT TROPONIN, ED: TROPONIN I, POC: 0 ng/mL (ref 0.00–0.08)

## 2014-11-28 LAB — ETHANOL: Alcohol, Ethyl (B): <5 mg/dL (ref <5)

## 2014-11-28 LAB — URINALYSIS, ROUTINE W REFLEX MICROSCOPIC
Glucose, UA: NEGATIVE mg/dL
Ketones, ur: 15 mg/dL — AB
NITRITE: NEGATIVE
PH: 5.5 (ref 5.0–8.0)
Protein, ur: 30 mg/dL — AB
SPECIFIC GRAVITY, URINE: 1.036 — AB (ref 1.005–1.030)

## 2014-11-28 LAB — SALICYLATE LEVEL

## 2014-11-28 LAB — I-STAT BETA HCG BLOOD, ED (MC, WL, AP ONLY)

## 2014-11-28 LAB — D-DIMER, QUANTITATIVE: D-Dimer, Quant: 3.22 ug/mL-FEU — ABNORMAL HIGH (ref 0.00–0.50)

## 2014-11-28 LAB — CBC
HCT: 34.9 % — ABNORMAL LOW (ref 36.0–46.0)
Hemoglobin: 11.2 g/dL — ABNORMAL LOW (ref 12.0–15.0)
MCH: 27.1 pg (ref 26.0–34.0)
MCHC: 32.1 g/dL (ref 30.0–36.0)
MCV: 84.5 fL (ref 78.0–100.0)
PLATELETS: 247 10*3/uL (ref 150–400)
RBC: 4.13 MIL/uL (ref 3.87–5.11)
RDW: 13.8 % (ref 11.5–15.5)
WBC: 11.6 10*3/uL — AB (ref 4.0–10.5)

## 2014-11-28 LAB — WET PREP, GENITAL
Clue Cells Wet Prep HPF POC: NONE SEEN
Sperm: NONE SEEN
Trich, Wet Prep: NONE SEEN
YEAST WET PREP: NONE SEEN

## 2014-11-28 LAB — RAPID URINE DRUG SCREEN, HOSP PERFORMED
Amphetamines: NOT DETECTED
BARBITURATES: NOT DETECTED
Benzodiazepines: NOT DETECTED
COCAINE: NOT DETECTED
OPIATES: NOT DETECTED
Tetrahydrocannabinol: POSITIVE — AB

## 2014-11-28 LAB — ACETAMINOPHEN LEVEL

## 2014-11-28 LAB — URINE MICROSCOPIC-ADD ON

## 2014-11-28 LAB — LIPASE, BLOOD: Lipase: 21 U/L (ref 11–51)

## 2014-11-28 MED ORDER — KETOROLAC TROMETHAMINE 30 MG/ML IJ SOLN
30.0000 mg | Freq: Once | INTRAMUSCULAR | Status: AC
  Administered 2014-11-28: 30 mg via INTRAMUSCULAR
  Filled 2014-11-28: qty 1

## 2014-11-28 MED ORDER — IOHEXOL 350 MG/ML SOLN
100.0000 mL | Freq: Once | INTRAVENOUS | Status: AC | PRN
  Administered 2014-11-28: 100 mL via INTRAVENOUS

## 2014-11-28 MED ORDER — NITROFURANTOIN MONOHYD MACRO 100 MG PO CAPS: 100.0000 mg | ORAL_CAPSULE | Freq: Two times a day (BID) | ORAL | Status: DC

## 2014-11-28 MED ORDER — SODIUM CHLORIDE 0.9 % IV BOLUS (SEPSIS)
1000.0000 mL | Freq: Once | INTRAVENOUS | Status: AC
  Administered 2014-11-28: 1000 mL via INTRAVENOUS

## 2014-11-28 MED ORDER — HYDROCODONE-ACETAMINOPHEN 5-325 MG PO TABS: 1.0000 | ORAL_TABLET | ORAL | Status: DC | PRN

## 2014-11-28 MED ORDER — OXYCODONE-ACETAMINOPHEN 5-325 MG PO TABS
1.0000 | ORAL_TABLET | Freq: Once | ORAL | Status: AC
  Administered 2014-11-28: 1 via ORAL
  Filled 2014-11-28: qty 1

## 2014-11-28 NOTE — Discharge Instructions (Signed)
Your CT scans and Ultrasound were normal. Your pain is likely muscular strain. You may take tylenol as needed for pain. I will give you a short course of Vicodin as needed for pain. You also have a urinary tract infection. Take the antibiotic until you finish the entire bottle. You cannot breast feed while on the antibiotic or pain medicine. Please follow-up with a primary care doctor within one week. If you do not have one make an appt at one of the clinics listed below.   Take medications as prescribed. Return to the emergency room for worsening condition or new concerning symptoms. Follow up with your regular doctor. If you don't have a regular doctor use one of the numbers below to establish a primary care doctor.   Emergency Department Resource Guide 1) Find a Doctor and Pay Out of Pocket Although you won't have to find out who is covered by your insurance plan, it is a good idea to ask around and get recommendations. You will then need to call the office and see if the doctor you have chosen will accept you as a new patient and what types of options they offer for patients who are self-pay. Some doctors offer discounts or will set up payment plans for their patients who do not have insurance, but you will need to ask so you aren't surprised when you get to your appointment.  2) Contact Your Local Health Department Not all health departments have doctors that can see patients for sick visits, but many do, so it is worth a call to see if yours does. If you don't know where your local health department is, you can check in your phone book. The CDC also has a tool to help you locate your state's health department, and many state websites also have listings of all of their local health departments.  3) Find a Walk-in Clinic If your illness is not likely to be very severe or complicated, you may want to try a walk in clinic. These are popping up all over the country in pharmacies, drugstores, and  shopping centers. They're usually staffed by nurse practitioners or physician assistants that have been trained to treat common illnesses and complaints. They're usually fairly quick and inexpensive. However, if you have serious medical issues or chronic medical problems, these are probably not your best option.  No Primary Care Doctor: - Call Health Connect at  [phone] - they can help you locate a primary care doctor that  accepts your insurance, provides certain services, etc. - Physician Referral [phone]- [phone]  Emergency Department Resource Guide 1) Find a Doctor and Pay Out of Pocket Although you won't have to find out who is covered by your insurance plan, it is a good idea to ask around and get recommendations. You will then need to call the office and see if the doctor you have chosen will accept you as a new patient and what types of options they offer for patients who are self-pay. Some doctors offer discounts or will set up payment plans for their patients who do not have insurance, but you will need to ask so you aren't surprised when you get to your appointment.  2) Contact Your Local Health Department Not all health departments have doctors that can see patients for sick visits, but many do, so it is worth a call to see if yours does. If you don't know where your local health department is, you can check in your phone book. The CDC  also has a tool to help you locate your state's health department, and many state websites also have listings of all of their local health departments.  3) Find a Walk-in Clinic If your illness is not likely to be very severe or complicated, you may want to try a walk in clinic. These are popping up all over the country in pharmacies, drugstores, and shopping centers. They're usually staffed by nurse practitioners or physician assistants that have been trained to treat common illnesses and complaints. They're usually fairly quick and inexpensive.  However, if you have serious medical issues or chronic medical problems, these are probably not your best option.  No Primary Care Doctor: - Call Health Connect at  [phone] - they can help you locate a primary care doctor that  accepts your insurance, provides certain services, etc. - Physician Referral Service- [phone]  Chronic Pain Problems: Organization         Address  Phone   Notes  [facility]  [phone] Patients need to be referred by their primary care doctor.   Medication Assistance: Organization         Address  Phone   Notes  [facility] Medication [facility] [address] [place] [place], [place] [address] [phone] --Must be a resident of [facility] -- Must have NO insurance coverage whatsoever (no Medicaid/ Medicare, etc.) -- The pt. MUST have a primary care doctor that directs their care regularly and follows them in the community   [facility]  [phone]   [employer]  [phone]    Agencies that provide inexpensive medical care: Organization         Address  Phone   Notes  [facility]  [phone]   [facility]    [phone]   [facility] [address] [place], [place] [address] [phone]   [facility] [place] [address] [place] [place] [phone]   Planned Parenthood    [phone]   [facility]    [phone]   [facility]  [address], [place] Phone:  [phone], Fax:  [phone] Hours of Operation:  9 am - 6 pm, M-F.  Also accepts Medicaid/Medicare and self-pay.  [facility]  [address], [place] Phone: [phone], Fax: [phone]. Hours of Operation:  8:30 am - 5:30 pm, M-F.  Also accepts Medicaid and self-pay.  [facility] [place] [address] [place] Phone: [phone]   [facility] [address] Natasha Bence [place], [place] [phone], Ext. [phone] Mondays & Thursdays: 7-9 AM.  First 15 patients are seen on a first come, first serve basis.    Medicaid-accepting [facility] Providers:  Organization         Address  Phone   Notes  [facility] [address], [place] [phone] Also accepts self-pay patients.  [facility] [address] Laurell Josephs [place], [place]  [phone]   [facility] [address], [place] [phone]   [facility] [address], [place] [phone]   Renaye Rakers [address], [place]   [phone] Only accepts [place] patients after they have their name applied to their card.   Self-Pay (no insurance)  in [facility]:  Organization         Address  Phone   Notes  Sickle Cell Patients, [facility] Internal Medicine [address] [place], [place] [phone]   [facility] [address] [place], [place] [phone]   [facility]  [address], [place] [phone]   [facility]/Dr. Osei-Bonsu  [address] [place] or [address], [place] [phone] Phone number for both [place] and [place] locations is the same.  [facility] [address] [place] [phone]   [facility] [address] [place] or [address] [phone]   [facility] [address], [place] [phone], phone; [phone], fax Sees patients 1st and 3rd Saturday of every month.  Must not qualify for public or private insurance (i.e. Medicaid, Medicare,  Health Choice, Veterans' Benefits)  Household income should be no more than 200% of the poverty level The clinic cannot treat you if you are pregnant or think  you are pregnant  Sexually transmitted diseases are not treated at the clinic.

## 2014-11-28 NOTE — ED Notes (Signed)
PA At bedside

## 2014-11-28 NOTE — ED Provider Notes (Signed)
CSN: [id number]     Arrival date & time 11/28/14  1507 History   First MD Initiated Contact with Patient 11/28/14 1615     Chief Complaint  Patient presents with  . Abdominal Pain  . Chest Pain    HPI   Rose Gibbs is an 19 y.o. female with no significant PMH who presents to the ED for evaluation of chest pain and abdominal pain. She states that both started last night. She states that her chest pain is all over and it hurts to take a deep breath. She feels like she has to take short quick breaths due to the pain. She also reports RLQ pain that radiates to around her side to her flank/back. She states that it is very sharp, severe, and constant. She states she is unable to lay flat due to the pain. Denies nausea but she states she has no appetite so has only had a few bites to eat today. Denies emesis or diarrhea. States tmax at home was 99.7. She states she has not taken anything for her pain. Denies dysuria, urinary frequency or urgency. Denies new vaginal bleeding, discharge, itching, or lesions. Admits to smoking a few cigarettes daily. Denies EtOH or other illicit drugs. UDS from 11/16/14 positive for THC and amphetamines. She reports she is currently breast-feeding. Denies history of Gibbs stones. States she still has her appendix.   Past Medical History  Diagnosis Date  . Medical history non-contributory   . Hx of varicella    Past Surgical History  Procedure Laterality Date  . Mouth surgery    . Oral cyst removal     History reviewed. No pertinent family history. Social History  Substance Use Topics  . Smoking status: Current Every Day Smoker -- 0.50 packs/day    Types: Cigarettes  . Smokeless tobacco: Never Used  . Alcohol Use: No   OB History    Gravida Para Term Preterm AB TAB SAB Ectopic Multiple Living   2 1 1  0 1 0 1 0 0 1     Review of Systems  All other systems reviewed and are negative.     Allergies  Amoxicillin; Penicillins; and Latex  Home Medications    Prior to Admission medications   Medication Sig Start Date End Date Taking? Authorizing Provider  docusate sodium (COLACE) 100 MG capsule Take 1 capsule (100 mg total) by mouth 2 (two) times daily. Patient not taking: Reported on 11/16/2014 10/04/14   Waynard Reeds, MD  ibuprofen (ADVIL,MOTRIN) 600 MG tablet Take 1 tablet (600 mg total) by mouth every 6 (six) hours as needed. Patient not taking: Reported on 11/16/2014 10/04/14   Waynard Reeds, MD  oxyCODONE-acetaminophen (ROXICET) 5-325 MG per tablet Take 2 tablets by mouth every 4 (four) hours as needed. May take 1-2 tablets every 4-6 hours as needed for pain Patient not taking: Reported on 11/16/2014 10/04/14   Waynard Reeds, MD  potassium chloride (K-DUR) 10 MEQ tablet Take 2 tablets (20 mEq total) by mouth daily. 11/16/14   April Palumbo, MD  Prenatal Vit-Fe Fumarate-FA (PRENATAL VITAMIN) 27-0.8 MG TABS Take 1 tablet by mouth daily. Patient not taking: Reported on 11/16/2014 02/19/14   Hayden Rasmussen, NP   BP 125/89 mmHg  Pulse 112  Temp(Src) 98.3 F (36.8 C) (Oral)  Resp 20  Ht 5\' 3"  (1.6 m)  Wt 113 lb 3.2 oz (51.347 kg)  BMI 20.06 kg/m2  SpO2 99%  LMP 11/23/2014  Breastfeeding? Yes Physical Exam  Constitutional: She is oriented  to person, place, and time.  Appears pale, uncomfortable, NAD  HENT:  Head: Normocephalic.  Right Ear: External ear normal.  Left Ear: External ear normal.  Nose: Nose normal.  Mouth/Throat: Oropharynx is clear and moist. No oropharyngeal exudate.  Eyes: Conjunctivae and EOM are normal. Pupils are equal, round, and reactive to light.  Neck: Normal range of motion. Neck supple.  Cardiovascular: Regular rhythm and normal heart sounds.  Tachycardia present.   Pulmonary/Chest: Effort normal and breath sounds normal. No respiratory distress. She exhibits no tenderness.  Abdominal: Soft. Bowel sounds are normal. She exhibits no distension.  Marked ttp of RLQ with +guarding. No rebound tenderness. Negative rovsing's sign.  Marked R CVA tenderness  Genitourinary:  Copious yellowish white smooth discharge in vaginal canal. +R adnexal tenderness. No CMT. Cervix not friable.   Musculoskeletal: Normal range of motion. She exhibits no edema.  Neurological: She is alert and oriented to person, place, and time.    ED Course  Procedures (including critical care time) Labs Review Labs Reviewed  WET PREP, GENITAL - Abnormal; Notable for the following:    WBC, Wet Prep HPF POC MODERATE (*)    All other components within normal limits  BASIC METABOLIC PANEL - Abnormal; Notable for the following:    Potassium 3.2 (*)    Glucose, Bld 108 (*)    All other components within normal limits  CBC - Abnormal; Notable for the following:    WBC 11.6 (*)    Hemoglobin 11.2 (*)    HCT 34.9 (*)    All other components within normal limits  D-DIMER, QUANTITATIVE (NOT AT [facility]) - Abnormal; Notable for the following:    D-Dimer, Quant 3.22 (*)    All other components within normal limits  URINALYSIS, ROUTINE W REFLEX MICROSCOPIC (NOT AT [facility]) - Abnormal; Notable for the following:    Color, Urine ORANGE (*)    APPearance CLOUDY (*)    Specific Gravity, Urine 1.036 (*)    Hgb urine dipstick TRACE (*)    Bilirubin Urine MODERATE (*)    Ketones, ur 15 (*)    Protein, ur 30 (*)    Leukocytes, UA SMALL (*)    All other components within normal limits  URINE RAPID DRUG SCREEN, HOSP PERFORMED - Abnormal; Notable for the following:    Tetrahydrocannabinol POSITIVE (*)    All other components within normal limits  ACETAMINOPHEN LEVEL - Abnormal; Notable for the following:    Acetaminophen (Tylenol), Serum <10 (*)    All other components within normal limits  URINE MICROSCOPIC-ADD ON - Abnormal; Notable for the following:    Squamous Epithelial / LPF 6-30 (*)    Bacteria, UA FEW (*)    All other components within normal limits  URINE CULTURE  LIPASE, BLOOD  ETHANOL  SALICYLATE LEVEL  I-STAT TROPOININ, ED  I-STAT BETA HCG  BLOOD, ED ([facility], [facility], [facility] ONLY)  GC/CHLAMYDIA PROBE AMP ([place]) NOT AT [facility]    Imaging Review Dg Chest 2 View  11/28/2014  CLINICAL DATA:  Patient with left lower abdominal pain. EXAM: CHEST  2 VIEW COMPARISON:  Chest radiograph 11/16/2014. FINDINGS: Stable cardiac and mediastinal contours. No consolidative pulmonary opacities. No pleural effusion or pneumothorax. Scoliotic curvature of the thoracolumbar spine. IMPRESSION: No acute cardiopulmonary process. Electronically Signed   By: Annia Belt M.D.   On: 11/28/2014 16:19   Ct Angio Chest Pe W/cm &/or Wo Cm  11/28/2014  CLINICAL DATA:  Acute onset of generalized chest pain and  shortness breath. Elevated D-dimer. Right lower quadrant abdominal pain. Initial encounter. EXAM: CT ANGIOGRAPHY CHEST CT ABDOMEN AND PELVIS WITH CONTRAST TECHNIQUE: Multidetector CT imaging of the chest was performed using the standard protocol during bolus administration of intravenous contrast. Multiplanar CT image reconstructions and MIPs were obtained to evaluate the vascular anatomy. Multidetector CT imaging of the abdomen and pelvis was performed using the standard protocol during bolus administration of intravenous contrast. CONTRAST:  OMNIPAQUE IOHEXOL 350 MG/ML SOLN COMPARISON:  Chest radiograph performed earlier today at 3:55 p.m. FINDINGS: CTA CHEST FINDINGS There is no evidence of pulmonary embolus. Mild bilateral dependent subsegmental atelectasis is noted. The lungs are otherwise clear. There is no evidence of significant focal consolidation, pleural effusion or pneumothorax. No masses are identified; no abnormal focal contrast enhancement is seen. The mediastinum is unremarkable in appearance. No mediastinal lymphadenopathy is seen. No pericardial effusion is identified. The great vessels are grossly unremarkable in appearance. Residual thymic tissue is within normal limits. No axillary lymphadenopathy is seen. The visualized portions of the thyroid gland  are unremarkable in appearance. No acute osseous abnormalities are seen. CT ABDOMEN and PELVIS FINDINGS The liver and spleen are unremarkable in appearance. The gallbladder is within normal limits. The pancreas and adrenal glands are unremarkable. The kidneys are unremarkable in appearance. There is no evidence of hydronephrosis. No renal or ureteral stones are seen. No perinephric stranding is appreciated. The small bowel is unremarkable in appearance. The stomach is within normal limits. No acute vascular abnormalities are seen. The appendix is normal in caliber and contains contrast, without evidence of appendicitis. Trace fluid is noted at the right lower quadrant. The colon is unremarkable in appearance. Soft tissue stranding extends about the somewhat redundant transverse colon, which is filled with stool, though this is thought to be secondary to inflammation about the bladder. The bladder is decompressed and not well assessed, though it appears somewhat thick walled. Soft tissue stranding is suggested about the bladder, raising question for cystitis. The uterus is grossly unremarkable. A 3.4 cm left adnexal cystic focus is noted, likely physiologic given the patient's age. The ovaries are otherwise unremarkable. No inguinal lymphadenopathy is seen. No acute osseous abnormalities are identified. Review of the MIP images confirms the above findings. IMPRESSION: 1. No evidence of pulmonary embolus. 2. Soft tissue stranding suggested about the bladder, raising question for cystitis. Apparent bladder wall thickening. Would correlate for any associated symptoms. Trace fluid noted at the right lower quadrant. 3. Mild bilateral dependent subsegmental atelectasis noted. Lungs otherwise clear. 4. Appendix unremarkable in appearance. Electronically Signed   By: Roanna Raider M.D.   On: 11/28/2014 21:30   US Transvaginal Non-ob  11/28/2014  CLINICAL DATA:  Right lower quadrant pain for 2 days. EXAM: TRANSABDOMINAL  AND TRANSVAGINAL ULTRASOUND OF PELVIS DOPPLER ULTRASOUND OF OVARIES TECHNIQUE: Both transabdominal and transvaginal ultrasound examinations of the pelvis were performed. Transabdominal technique was performed for global imaging of the pelvis including uterus, ovaries, adnexal regions, and pelvic cul-de-sac. It was necessary to proceed with endovaginal exam following the transabdominal exam to visualize the uterus and ovaries. Color and duplex Doppler ultrasound was utilized to evaluate blood flow to the ovaries. COMPARISON:  CT abdomen and pelvis 11/2014 FINDINGS: Uterus Measurements: 8.3 x 4.8 x 5.9 cm. Uterus is retroflexed. No fibroids or other mass visualized. Endometrium Thickness: 8.5 mm.  No focal abnormality visualized. Right ovary Measurements: 4.1 x 1.8 x 3.5 cm. Normal appearance/no adnexal mass. Left ovary Measurements: 5 x 3.3 x 4.3 cm.  Uncomplicated cyst measuring 3.9 x 2.8 x 2.7 cm corresponding to changes on CT. This is likely functional and a reproductive age patient and no follow-up is needed. Pulsed Doppler evaluation of both ovaries demonstrates normal low-resistance arterial and venous waveforms. Flow is demonstrated in both ovaries on color flow Doppler imaging. Other findings No free fluid. IMPRESSION: Benign-appearing cysts in the left ovary with maximal diameter of 3.9 cm. No follow-up necessary in a reproductive age patient. Uterus and right ovary are unremarkable. No evidence of ovarian torsion. Electronically Signed   By: Burman NievesWilliam  Stevens M.D.   On: 11/28/2014 22:56   [place] Pelvis Complete  11/28/2014  CLINICAL DATA:  Right lower quadrant pain for 2 days. EXAM: TRANSABDOMINAL AND TRANSVAGINAL ULTRASOUND OF PELVIS DOPPLER ULTRASOUND OF OVARIES TECHNIQUE: Both transabdominal and transvaginal ultrasound examinations of the pelvis were performed. Transabdominal technique was performed for global imaging of the pelvis including uterus, ovaries, adnexal regions, and pelvic cul-de-sac. It was  necessary to proceed with endovaginal exam following the transabdominal exam to visualize the uterus and ovaries. Color and duplex Doppler ultrasound was utilized to evaluate blood flow to the ovaries. COMPARISON:  CT abdomen and pelvis 11/2014 FINDINGS: Uterus Measurements: 8.3 x 4.8 x 5.9 cm. Uterus is retroflexed. No fibroids or other mass visualized. Endometrium Thickness: 8.5 mm.  No focal abnormality visualized. Right ovary Measurements: 4.1 x 1.8 x 3.5 cm. Normal appearance/no adnexal mass. Left ovary Measurements: 5 x 3.3 x 4.3 cm. Uncomplicated cyst measuring 3.9 x 2.8 x 2.7 cm corresponding to changes on CT. This is likely functional and a reproductive age patient and no follow-up is needed. Pulsed Doppler evaluation of both ovaries demonstrates normal low-resistance arterial and venous waveforms. Flow is demonstrated in both ovaries on color flow Doppler imaging. Other findings No free fluid. IMPRESSION: Benign-appearing cysts in the left ovary with maximal diameter of 3.9 cm. No follow-up necessary in a reproductive age patient. Uterus and right ovary are unremarkable. No evidence of ovarian torsion. Electronically Signed   By: Burman NievesWilliam  Stevens M.D.   On: 11/28/2014 22:56   Ct Abdomen Pelvis W Contrast  11/28/2014  CLINICAL DATA:  Acute onset of generalized chest pain and shortness breath. Elevated D-dimer. Right lower quadrant abdominal pain. Initial encounter. EXAM: CT ANGIOGRAPHY CHEST CT ABDOMEN AND PELVIS WITH CONTRAST TECHNIQUE: Multidetector CT imaging of the chest was performed using the standard protocol during bolus administration of intravenous contrast. Multiplanar CT image reconstructions and MIPs were obtained to evaluate the vascular anatomy. Multidetector CT imaging of the abdomen and pelvis was performed using the standard protocol during bolus administration of intravenous contrast. CONTRAST:  100mL OMNIPAQUE IOHEXOL 350 MG/ML SOLN COMPARISON:  Chest radiograph performed earlier  today at 3:55 p.m. FINDINGS: CTA CHEST FINDINGS There is no evidence of pulmonary embolus. Mild bilateral dependent subsegmental atelectasis is noted. The lungs are otherwise clear. There is no evidence of significant focal consolidation, pleural effusion or pneumothorax. No masses are identified; no abnormal focal contrast enhancement is seen. The mediastinum is unremarkable in appearance. No mediastinal lymphadenopathy is seen. No pericardial effusion is identified. The great vessels are grossly unremarkable in appearance. Residual thymic tissue is within normal limits. No axillary lymphadenopathy is seen. The visualized portions of the thyroid gland are unremarkable in appearance. No acute osseous abnormalities are seen. CT ABDOMEN and PELVIS FINDINGS The liver and spleen are unremarkable in appearance. The gallbladder is within normal limits. The pancreas and adrenal glands are unremarkable. The kidneys are unremarkable in appearance. There is no  evidence of hydronephrosis. No renal or ureteral stones are seen. No perinephric stranding is appreciated. The small bowel is unremarkable in appearance. The stomach is within normal limits. No acute vascular abnormalities are seen. The appendix is normal in caliber and contains contrast, without evidence of appendicitis. Trace fluid is noted at the right lower quadrant. The colon is unremarkable in appearance. Soft tissue stranding extends about the somewhat redundant transverse colon, which is filled with stool, though this is thought to be secondary to inflammation about the bladder. The bladder is decompressed and not well assessed, though it appears somewhat thick walled. Soft tissue stranding is suggested about the bladder, raising question for cystitis. The uterus is grossly unremarkable. A 3.4 cm left adnexal cystic focus is noted, likely physiologic given the patient's age. The ovaries are otherwise unremarkable. No inguinal lymphadenopathy is seen. No acute  osseous abnormalities are identified. Review of the MIP images confirms the above findings. IMPRESSION: 1. No evidence of pulmonary embolus. 2. Soft tissue stranding suggested about the bladder, raising question for cystitis. Apparent bladder wall thickening. Would correlate for any associated symptoms. Trace fluid noted at the right lower quadrant. 3. Mild bilateral dependent subsegmental atelectasis noted. Lungs otherwise clear. 4. Appendix unremarkable in appearance. Electronically Signed   By: Roanna Raider M.D.   On: 11/28/2014 21:30   [place] Art/ven Flow Abd Pelv Doppler  11/28/2014  CLINICAL DATA:  Right lower quadrant pain for 2 days. EXAM: TRANSABDOMINAL AND TRANSVAGINAL ULTRASOUND OF PELVIS DOPPLER ULTRASOUND OF OVARIES TECHNIQUE: Both transabdominal and transvaginal ultrasound examinations of the pelvis were performed. Transabdominal technique was performed for global imaging of the pelvis including uterus, ovaries, adnexal regions, and pelvic cul-de-sac. It was necessary to proceed with endovaginal exam following the transabdominal exam to visualize the uterus and ovaries. Color and duplex Doppler ultrasound was utilized to evaluate blood flow to the ovaries. COMPARISON:  CT abdomen and pelvis 11/2014 FINDINGS: Uterus Measurements: 8.3 x 4.8 x 5.9 cm. Uterus is retroflexed. No fibroids or other mass visualized. Endometrium Thickness: 8.5 mm.  No focal abnormality visualized. Right ovary Measurements: 4.1 x 1.8 x 3.5 cm. Normal appearance/no adnexal mass. Left ovary Measurements: 5 x 3.3 x 4.3 cm. Uncomplicated cyst measuring 3.9 x 2.8 x 2.7 cm corresponding to changes on CT. This is likely functional and a reproductive age patient and no follow-up is needed. Pulsed Doppler evaluation of both ovaries demonstrates normal low-resistance arterial and venous waveforms. Flow is demonstrated in both ovaries on color flow Doppler imaging. Other findings No free fluid. IMPRESSION: Benign-appearing cysts in the  left ovary with maximal diameter of 3.9 cm. No follow-up necessary in a reproductive age patient. Uterus and right ovary are unremarkable. No evidence of ovarian torsion. Electronically Signed   By: Burman Nieves M.D.   On: 11/28/2014 22:56   I have personally reviewed and evaluated these images and lab results as part of my medical decision-making.   EKG Interpretation   Date/Time:  Sunday November 28 2014 15:28:58 EST Ventricular Rate:  110 PR Interval:  132 QRS Duration: 86 QT Interval:  324 QTC Calculation: 438 R Axis:   104 Text Interpretation:  Sinus tachycardia Rightward axis T wave abnormality,  consider inferior ischemia Abnormal ECG tachycardia. no change in QRS  morphology Confirmed by Donnald Garre, MD, Lebron Conners [phone]) on 11/28/2014 3:31:37  PM      MDM   Final diagnoses:  Chest pain  Right lower quadrant abdominal pain  Chest wall pain    Regarding chest pain,  am concerned about possible PE given pt's tachycardia, shortness of breath, and chest pain. She is 2 months post partum so not very recent hospitalization but cannot PERC rule out PE. D-dimer checked and was elevated so ordred CT angio. PE study.  Regarding abdominal pain, initially very tender in RLQ and R CVA tenderness. On pelvic exam she does have adnexal tenderness but difficult to localize. Ddx appendicitis vs ovarian torsion. Given that she is not extremely focally tender will get CT abd/pelvis. Will hold off on ultrasound for now.   BMP unremarkable. CBC shows slightly elevated white count.  UDS shows +THC. Discussed MJ cessation with pt especially as she is breast feeding. Pt initially had chest and abd pain relief with toradol but states her pain has returned. She declines further medication at this time and declines narcotics as she is breastfeeding.  CT shows no evidence of PE or appendicitis. However pt still endorse RLQ ttp as well as R chest wall tenderness. Will get transvag US to r/o ovarian torsion.  Pt reports extreme pain now. Discussed again that she should avoid breastfeeding if she is to take narcotics. Pt states she took percocet when she was last in the hospital and her doctors told her she would be okay to breastfeed and is requesting percocet now. Will give percocet but encouraged pt to avoid breastfeeding. CT also shows evidence of cystitis. UA shows some leuks and bacteria but pt is asymptomatic so initially had thought to defer treatment but given CT findings will give rx for macrobid as pt is allergic to PCNs.   US unremarkable. Discussed findings with pt. Will treat UTI. Pt states she is in a lot of pain and would like a short course of vicodin. She was instructed to avoid breast feeding for as long as she is taking any narcotic. Pt verbalized understanding. Return precautions given.    Carlene Coria, PA-C 11/29/14 0050  Richardean Canal, MD 11/30/14 [phone]

## 2014-11-28 NOTE — ED Notes (Signed)
MD at bedside. 

## 2014-11-28 NOTE — ED Notes (Signed)
Onset upon awakening yesterday am RLQ abd pain that shoots up to right shoulder and right chest, shortness of breath and fevers.  Standing makes better, laying down makes worse.

## 2014-11-28 NOTE — ED Notes (Signed)
Pt stable, ambulatory, states understanding of discharge instructions 

## 2014-11-28 NOTE — ED Notes (Signed)
Patient transported to X-ray 

## 2014-11-30 LAB — URINE CULTURE

## 2014-12-01 LAB — GC/CHLAMYDIA PROBE AMP (~~LOC~~) NOT AT ARMC
Chlamydia: NEGATIVE
Neisseria Gonorrhea: POSITIVE — AB

## 2014-12-03 ENCOUNTER — Telehealth (HOSPITAL_BASED_OUTPATIENT_CLINIC_OR_DEPARTMENT_OTHER): Payer: Self-pay | Admitting: Emergency Medicine

## 2014-12-03 NOTE — Telephone Encounter (Signed)
Chart handoff to EDP for treatment plan for Gonorrhea 

## 2014-12-05 ENCOUNTER — Telehealth (HOSPITAL_BASED_OUTPATIENT_CLINIC_OR_DEPARTMENT_OTHER): Payer: Self-pay | Admitting: Emergency Medicine

## 2014-12-06 ENCOUNTER — Encounter (HOSPITAL_COMMUNITY): Payer: Self-pay | Admitting: Family Medicine

## 2014-12-06 ENCOUNTER — Emergency Department (HOSPITAL_COMMUNITY)
Admission: EM | Admit: 2014-12-06 | Discharge: 2014-12-07 | Disposition: A | Discharge status: Home / Self Care | Payer: Medicaid Other | Attending: Emergency Medicine | Admitting: Emergency Medicine

## 2014-12-06 DIAGNOSIS — Z91040 Latex allergy status: Secondary | ICD-10-CM | POA: Diagnosis not present

## 2014-12-06 DIAGNOSIS — R1084 Generalized abdominal pain: Secondary | ICD-10-CM | POA: Diagnosis present

## 2014-12-06 DIAGNOSIS — F17210 Nicotine dependence, cigarettes, uncomplicated: Secondary | ICD-10-CM | POA: Insufficient documentation

## 2014-12-06 DIAGNOSIS — N730 Acute parametritis and pelvic cellulitis: Secondary | ICD-10-CM

## 2014-12-06 DIAGNOSIS — Z79899 Other long term (current) drug therapy: Secondary | ICD-10-CM | POA: Diagnosis not present

## 2014-12-06 DIAGNOSIS — Z880 Allergy status to penicillin: Secondary | ICD-10-CM | POA: Insufficient documentation

## 2014-12-06 DIAGNOSIS — N8302 Follicular cyst of left ovary: Secondary | ICD-10-CM | POA: Diagnosis not present

## 2014-12-06 DIAGNOSIS — Z3202 Encounter for pregnancy test, result negative: Secondary | ICD-10-CM | POA: Diagnosis not present

## 2014-12-06 DIAGNOSIS — N83202 Unspecified ovarian cyst, left side: Secondary | ICD-10-CM

## 2014-12-06 LAB — COMPREHENSIVE METABOLIC PANEL
ALBUMIN: 3.2 g/dL — AB (ref 3.5–5.0)
ALK PHOS: 89 U/L (ref 38–126)
ALT: 14 U/L (ref 14–54)
AST: 13 U/L — AB (ref 15–41)
Anion gap: 9 (ref 5–15)
BILIRUBIN TOTAL: 0.2 mg/dL — AB (ref 0.3–1.2)
BUN: 7 mg/dL (ref 6–20)
CALCIUM: 8.8 mg/dL — AB (ref 8.9–10.3)
CO2: 25 mmol/L (ref 22–32)
Chloride: 98 mmol/L — ABNORMAL LOW (ref 101–111)
Creatinine, Ser: 0.65 mg/dL (ref 0.44–1.00)
GFR calc Af Amer: >60 mL/min (ref >60)
GFR calc non Af Amer: >60 mL/min (ref >60)
GLUCOSE: 116 mg/dL — AB (ref 65–99)
POTASSIUM: 3.8 mmol/L (ref 3.5–5.1)
Sodium: 132 mmol/L — ABNORMAL LOW (ref 135–145)
TOTAL PROTEIN: 7.4 g/dL (ref 6.5–8.1)

## 2014-12-06 LAB — URINALYSIS, ROUTINE W REFLEX MICROSCOPIC
BILIRUBIN URINE: NEGATIVE
Glucose, UA: NEGATIVE mg/dL
KETONES UR: NEGATIVE mg/dL
NITRITE: NEGATIVE
PH: 6 (ref 5.0–8.0)
Protein, ur: NEGATIVE mg/dL
Specific Gravity, Urine: 1.018 (ref 1.005–1.030)

## 2014-12-06 LAB — LIPASE, BLOOD: Lipase: 21 U/L (ref 11–51)

## 2014-12-06 LAB — CBC
HEMATOCRIT: 35.3 % — AB (ref 36.0–46.0)
Hemoglobin: 11 g/dL — ABNORMAL LOW (ref 12.0–15.0)
MCH: 26.3 pg (ref 26.0–34.0)
MCHC: 31.2 g/dL (ref 30.0–36.0)
MCV: 84.2 fL (ref 78.0–100.0)
Platelets: 587 10*3/uL — ABNORMAL HIGH (ref 150–400)
RBC: 4.19 MIL/uL (ref 3.87–5.11)
RDW: 14.3 % (ref 11.5–15.5)
WBC: 13.9 10*3/uL — ABNORMAL HIGH (ref 4.0–10.5)

## 2014-12-06 LAB — URINE MICROSCOPIC-ADD ON

## 2014-12-06 LAB — I-STAT BETA HCG BLOOD, ED (MC, WL, AP ONLY): I-stat hCG, quantitative: <5 m[IU]/mL (ref <5)

## 2014-12-06 MED ORDER — OXYCODONE-ACETAMINOPHEN 5-325 MG PO TABS
ORAL_TABLET | ORAL | Status: AC
  Filled 2014-12-06: qty 1

## 2014-12-06 MED ORDER — OXYCODONE-ACETAMINOPHEN 5-325 MG PO TABS
1.0000 | ORAL_TABLET | Freq: Once | ORAL | Status: AC
  Administered 2014-12-06: 1 via ORAL

## 2014-12-06 NOTE — ED Notes (Signed)
Pt here for lower abd pain that started today. sts nausea.

## 2014-12-07 ENCOUNTER — Telehealth (HOSPITAL_COMMUNITY): Payer: Self-pay

## 2014-12-07 ENCOUNTER — Emergency Department (HOSPITAL_COMMUNITY): Payer: Medicaid Other

## 2014-12-07 LAB — GC/CHLAMYDIA PROBE AMP (~~LOC~~) NOT AT ARMC
CHLAMYDIA, DNA PROBE: NEGATIVE
Neisseria Gonorrhea: POSITIVE — AB

## 2014-12-07 LAB — PREGNANCY, URINE: Preg Test, Ur: NEGATIVE

## 2014-12-07 LAB — WET PREP, GENITAL
Sperm: NONE SEEN
Trich, Wet Prep: NONE SEEN
Yeast Wet Prep HPF POC: NONE SEEN

## 2014-12-07 MED ORDER — HYDROCODONE-ACETAMINOPHEN 5-325 MG PO TABS: 1.0000 | ORAL_TABLET | Freq: Once | ORAL | Status: DC

## 2014-12-07 MED ORDER — ONDANSETRON 4 MG PO TBDP: 4.0000 mg | ORAL_TABLET | Freq: Three times a day (TID) | ORAL | Status: DC | PRN

## 2014-12-07 MED ORDER — DOXYCYCLINE HYCLATE 100 MG PO TABS: 100.0000 mg | ORAL_TABLET | Freq: Two times a day (BID) | ORAL | Status: DC

## 2014-12-07 MED ORDER — DOXYCYCLINE HYCLATE 100 MG PO TABS
100.0000 mg | ORAL_TABLET | Freq: Once | ORAL | Status: AC
  Administered 2014-12-07: 100 mg via ORAL
  Filled 2014-12-07: qty 1

## 2014-12-07 MED ORDER — HYDROCODONE-ACETAMINOPHEN 5-325 MG PO TABS: 1.0000 | ORAL_TABLET | Freq: Four times a day (QID) | ORAL | Status: DC | PRN

## 2014-12-07 MED ORDER — HYDROCODONE-ACETAMINOPHEN 5-325 MG PO TABS
1.0000 | ORAL_TABLET | Freq: Once | ORAL | Status: AC
  Administered 2014-12-07: 1 via ORAL
  Filled 2014-12-07: qty 1

## 2014-12-07 MED ORDER — ONDANSETRON 4 MG PO TBDP
4.0000 mg | ORAL_TABLET | Freq: Once | ORAL | Status: AC
  Administered 2014-12-07: 4 mg via ORAL
  Filled 2014-12-07: qty 1

## 2014-12-07 MED ORDER — AZITHROMYCIN 250 MG PO TABS
2000.0000 mg | ORAL_TABLET | Freq: Once | ORAL | Status: AC
  Administered 2014-12-07: 2000 mg via ORAL
  Filled 2014-12-07: qty 8

## 2014-12-07 MED ORDER — METRONIDAZOLE 500 MG PO TABS: 500.0000 mg | ORAL_TABLET | Freq: Two times a day (BID) | ORAL | Status: DC

## 2014-12-07 MED ORDER — METRONIDAZOLE 500 MG PO TABS
500.0000 mg | ORAL_TABLET | Freq: Once | ORAL | Status: AC
Start: 2014-12-07 — End: 2014-12-07
  Administered 2014-12-07: 500 mg via ORAL
  Filled 2014-12-07: qty 1

## 2014-12-07 NOTE — ED Provider Notes (Signed)
CSN: [id number]     Arrival date & time 12/06/14  1807 History   First MD Initiated Contact with Patient 12/06/14 2314     Chief Complaint  Patient presents with  . Abdominal Pain     (Consider location/radiation/quality/duration/timing/severity/associated sxs/prior Treatment) HPI Comments: This is a 19 year old who is 2 months postpartum who had unprotected intercourse approximately a week and a half ago, now with severe abdominal pain, vaginal discharge.  She states she had a normal menstrual cycle approximately 2 weeks ago  Patient is a 20 y.o. female presenting with abdominal pain. The history is provided by the patient.  Abdominal Pain Pain location:  Generalized Pain radiates to:  Does not radiate Pain severity:  Severe Onset quality:  Gradual Timing:  Constant Progression:  Worsening Chronicity:  New Context: recent sexual activity   Associated symptoms: dysuria and vaginal discharge   Associated symptoms: no fever     Past Medical History  Diagnosis Date  . Medical history non-contributory   . Hx of varicella    Past Surgical History  Procedure Laterality Date  . Mouth surgery    . Oral cyst removal     History reviewed. No pertinent family history. Social History  Substance Use Topics  . Smoking status: Current Every Day Smoker -- 0.50 packs/day    Types: Cigarettes  . Smokeless tobacco: Never Used  . Alcohol Use: No   OB History    Gravida Para Term Preterm AB TAB SAB Ectopic Multiple Living   0 1 0 1 0 0 1     Review of Systems  Constitutional: Negative for fever.  Gastrointestinal: Positive for abdominal pain.  Genitourinary: Positive for dysuria, vaginal discharge and vaginal pain.      Allergies  Amoxicillin; Penicillins; and Latex  Home Medications   Prior to Admission medications   Medication Sig Start Date End Date Taking? Authorizing Provider  nitrofurantoin, macrocrystal-monohydrate, (MACROBID) 100 MG capsule Take 1 capsule  (100 mg total) by mouth 2 (two) times daily. 11/28/14  Yes Ace Gins Sam, PA-C  Prenatal Vit-Fe Fumarate-FA (PRENATAL VITAMIN) 27-0.8 MG TABS Take 1 tablet by mouth daily. 02/19/14  Yes Hayden Rasmussen, NP  docusate sodium (COLACE) 100 MG capsule Take 1 capsule (100 mg total) by mouth 2 (two) times daily. Patient not taking: Reported on 11/16/2014 10/04/14   Waynard Reeds, MD  doxycycline (VIBRA-TABS) 100 MG tablet Take 1 tablet (100 mg total) by mouth 2 (two) times daily. 12/07/14   Earley Favor, NP  HYDROcodone-acetaminophen (NORCO/VICODIN) 5-325 MG tablet Take 1 tablet by mouth once. 12/07/14   Earley Favor, NP  HYDROcodone-acetaminophen (NORCO/VICODIN) 5-325 MG tablet Take 1 tablet by mouth every 6 (six) hours as needed for severe pain. 12/07/14   Earley Favor, NP  ibuprofen (ADVIL,MOTRIN) 600 MG tablet Take 1 tablet (600 mg total) by mouth every 6 (six) hours as needed. Patient not taking: Reported on 11/16/2014 10/04/14   Waynard Reeds, MD  metroNIDAZOLE (FLAGYL) 500 MG tablet Take 1 tablet (500 mg total) by mouth 2 (two) times daily. 12/07/14   Earley Favor, NP  ondansetron (ZOFRAN ODT) 4 MG disintegrating tablet Take 1 tablet (4 mg total) by mouth every 8 (eight) hours as needed for nausea or vomiting. 12/07/14   Earley Favor, NP  oxyCODONE-acetaminophen (ROXICET) 5-325 MG per tablet Take 2 tablets by mouth every 4 (four) hours as needed. May take 1-2 tablets every 4-6 hours as needed for pain Patient not taking: Reported on 11/16/2014 10/04/14  Waynard ReedsKendra Ross, MD  potassium chloride (K-DUR) 10 MEQ tablet Take 2 tablets (20 mEq total) by mouth daily. Patient not taking: Reported on 12/07/2014 11/16/14   April Palumbo, MD   BP 104/57 mmHg  Pulse 110  Temp(Src) 98.9 F (37.2 C) (Oral)  Resp 17  SpO2 99%  LMP 11/23/2014 Physical Exam  Constitutional: She appears well-developed and well-nourished.  HENT:  Head: Normocephalic.  Eyes: Pupils are equal, round, and reactive to light.  Cardiovascular: Normal rate  and regular rhythm.   Pulmonary/Chest: Effort normal.  Abdominal: Soft. She exhibits no distension. There is tenderness.  Genitourinary: Uterus is tender. Cervix exhibits motion tenderness and discharge. Right adnexum displays tenderness. Left adnexum displays tenderness. Vaginal discharge found.  Neurological: She is alert.  Skin: Skin is warm and dry.    ED Course  Procedures (including critical care time) Labs Review Labs Reviewed  WET PREP, GENITAL - Abnormal; Notable for the following:    Clue Cells Wet Prep HPF POC PRESENT (*)    WBC, Wet Prep HPF POC MANY (*)    All other components within normal limits  COMPREHENSIVE METABOLIC PANEL - Abnormal; Notable for the following:    Sodium 132 (*)    Chloride 98 (*)    Glucose, Bld 116 (*)    Calcium 8.8 (*)    Albumin 3.2 (*)    AST 13 (*)    Total Bilirubin 0.2 (*)    All other components within normal limits  CBC - Abnormal; Notable for the following:    WBC 13.9 (*)    Hemoglobin 11.0 (*)    HCT 35.3 (*)    Platelets 587 (*)    All other components within normal limits  URINALYSIS, ROUTINE W REFLEX MICROSCOPIC (NOT [facility]) - Abnormal; Notable for the following:    APPearance HAZY (*)    Hgb urine dipstick TRACE (*)    Leukocytes, UA MODERATE (*)    All other components within normal limits  URINE MICROSCOPIC-ADD ON - Abnormal; Notable for the following:    Squamous Epithelial / LPF 6-30 (*)    Bacteria, UA FEW (*)    All other components within normal limits  LIPASE, BLOOD  PREGNANCY, URINE  I-STAT BETA HCG BLOOD, ED ([facility], [facility], [facility] ONLY)  POC URINE PREG, ED  I-STAT BETA HCG BLOOD, ED ([facility], [facility], [facility] ONLY)  GC/CHLAMYDIA PROBE AMP ([place]) NOT AT [facility]    Imaging Review [place] Transvaginal Non-ob  12/07/2014  CLINICAL DATA:  Lower abdominal pain, right greater than left. Eight weeks postpartum. EXAM: TRANSABDOMINAL AND TRANSVAGINAL ULTRASOUND OF PELVIS TECHNIQUE: Both transabdominal and transvaginal ultrasound  examinations of the pelvis were performed. Transabdominal technique was performed for global imaging of the pelvis including uterus, ovaries, adnexal regions, and pelvic cul-de-sac. It was necessary to proceed with endovaginal exam following the transabdominal exam to visualize the ovaries. COMPARISON:  11/28/2014 FINDINGS: Uterus Measurements: 10.3 x 5.8 x 4.8 cm. No fibroids or other mass visualized. Endometrium Thickness: 4 mm.  No focal abnormality visualized. Right ovary Measurements: 3.3 x 3.1 x 2.1 cm. There is a partially collapsed 1.5 cm cyst or follicle, likely physiologic. Left ovary Measurements: 2.9 x 3.4 x 4.2 cm. There is a 2.5 cm uncomplicated cyst which has decreased in size, measuring 3.9 cm on 11/28/2014. No solid or complex adnexal lesions. Other findings No free pelvic fluid. IMPRESSION: Uncomplicated left ovarian cyst which is decreased in size since 11/28/2014. Partially collapsed right ovarian cyst is probably physiologic. Normal  uterus. Electronically Signed   By: Ellery Plunk M.D.   On: 12/07/2014 01:33   US Pelvis Complete  12/07/2014  CLINICAL DATA:  Lower abdominal pain, right greater than left. Eight weeks postpartum. EXAM: TRANSABDOMINAL AND TRANSVAGINAL ULTRASOUND OF PELVIS TECHNIQUE: Both transabdominal and transvaginal ultrasound examinations of the pelvis were performed. Transabdominal technique was performed for global imaging of the pelvis including uterus, ovaries, adnexal regions, and pelvic cul-de-sac. It was necessary to proceed with endovaginal exam following the transabdominal exam to visualize the ovaries. COMPARISON:  11/28/2014 FINDINGS: Uterus Measurements: 10.3 x 5.8 x 4.8 cm. No fibroids or other mass visualized. Endometrium Thickness: 4 mm.  No focal abnormality visualized. Right ovary Measurements: 3.3 x 3.1 x 2.1 cm. There is a partially collapsed 1.5 cm cyst or follicle, likely physiologic. Left ovary Measurements: 2.9 x 3.4 x 4.2 cm. There is a 2.5 cm  uncomplicated cyst which has decreased in size, measuring 3.9 cm on 11/28/2014. No solid or complex adnexal lesions. Other findings No free pelvic fluid. IMPRESSION: Uncomplicated left ovarian cyst which is decreased in size since 11/28/2014. Partially collapsed right ovarian cyst is probably physiologic. Normal uterus. Electronically Signed   By: Ellery Plunk M.D.   On: 12/07/2014 01:33   I have personally reviewed and evaluated these images and lab results as part of my medical decision-making.   EKG Interpretation None     Patient clinically has PID.  She will be treated as such. She is allergic to penicillin with anaphylaxis and throat swelling.  She'll be given 2 g of azithromycin and started on 100 mg doxycycline twice a day for 2 weeks as well as Flagyl MDM   Final diagnoses:  Cyst of left ovary         Earley Favor, NP 12/07/14 1610  Alvira Monday, MD 12/07/14 1600

## 2014-12-07 NOTE — Discharge Instructions (Signed)
Pelvic Inflammatory Disease °Pelvic inflammatory disease (PID) refers to an infection in some or all of the female organs. The infection can be in the uterus, ovaries, fallopian tubes, or the surrounding tissues in the pelvis. PID can cause abdominal or pelvic pain that comes on suddenly (acute pelvic pain). PID is a serious infection because it can lead to lasting (chronic) pelvic pain or the inability to have children (infertility). °CAUSES °This condition is most often caused by an infection that is spread during sexual contact. However, the infection can also be caused by the normal bacteria that are found in the vaginal tissues if these bacteria travel upward into the reproductive organs. PID can also occur following: °· The birth of a baby. °· A miscarriage. °· An abortion. °· Major pelvic surgery. °· The use of an intrauterine device (IUD). °· A sexual assault. °RISK FACTORS °This condition is more likely to develop in women who: °· Are younger than 21 years of age. °· Are sexually active at a young age. °· Use nonbarrier contraception. °· Have multiple sexual partners. °· Have sex with someone who has symptoms of an STD (sexually transmitted disease). °· Use oral contraception. °At times, certain behaviors can also increase the possibility of getting PID, such as: °· Using a vaginal douche. °· Having an IUD in place. °SYMPTOMS °Symptoms of this condition include: °· Abdominal or pelvic pain. °· Fever. °· Chills. °· Abnormal vaginal discharge. °· Abnormal uterine bleeding. °· Unusual pain shortly after the end of a menstrual period. °· Painful urination. °· Pain with sexual intercourse. °· Nausea and vomiting. °DIAGNOSIS °To diagnose this condition, your health care provider will do a physical exam and take your medical history. A pelvic exam typically reveals great tenderness in the uterus and the surrounding pelvic tissues. You may also have tests, such as: °· Lab tests, including a pregnancy test, blood  tests, and urine test. °· Culture tests of the vagina and cervix to check for an STD. °· Ultrasound. °· A laparoscopic procedure to look inside the pelvis. °· Examining vaginal secretions under a microscope. °TREATMENT °Treatment for this condition may involve one or more approaches. °· Antibiotic medicines may be prescribed to be taken by mouth. °· Sexual partners may need to be treated if the infection is caused by an STD. °· For more severe cases, hospitalization may be needed to give antibiotics directly into a vein through an IV tube. °· Surgery may be needed if other treatments do not help, but this is rare. °It may take weeks until you are completely well. If you are diagnosed with PID, you should also be checked for human immunodeficiency virus (HIV). Your health care provider may test you for infection again 3 months after treatment. You should not have unprotected sex. °HOME CARE INSTRUCTIONS °· Take over-the-counter and prescription medicines only as told by your health care provider. °· If you were prescribed an antibiotic medicine, take it as told by your health care provider. Do not stop taking the antibiotic even if you start to feel better. °· Do not have sexual intercourse until treatment is completed or as told by your health care provider. If PID is confirmed, your recent sexual partners will need treatment, especially if you had unprotected sex. °· Keep all follow-up visits as told by your health care provider. This is important. °SEEK MEDICAL CARE IF: °· You have increased or abnormal vaginal discharge. °· Your pain does not improve. °· You vomit. °· You have a fever. °· You   cannot tolerate your medicines.  Your partner has an STD.  You have pain when you urinate. SEEK IMMEDIATE MEDICAL CARE IF:  You have increased abdominal or pelvic pain.  You have chills.  Your symptoms are not better in 72 hours even with treatment.   This information is not intended to replace advice given to  you by your health care provider. Make sure you discuss any questions you have with your health care provider.   Document Released: 12/25/2004 Document Revised: 09/15/2014 Document Reviewed: 02/01/2014 [employer] Interactive Patient Education [employer] Please take all the medications as directed follow up with your OB/GYN as needed

## 2014-12-07 NOTE — Telephone Encounter (Signed)
Unable to reach by telephone. Letter sent to address on record.  

## 2014-12-08 ENCOUNTER — Telehealth (HOSPITAL_BASED_OUTPATIENT_CLINIC_OR_DEPARTMENT_OTHER): Payer: Self-pay | Admitting: Emergency Medicine

## 2014-12-08 NOTE — Telephone Encounter (Signed)
Chart handoff to EDP for treatment plan for Gonorrhea 

## 2014-12-09 ENCOUNTER — Telehealth (HOSPITAL_BASED_OUTPATIENT_CLINIC_OR_DEPARTMENT_OTHER): Payer: Self-pay | Admitting: Emergency Medicine

## 2014-12-10 ENCOUNTER — Telehealth (HOSPITAL_COMMUNITY): Payer: Self-pay

## 2014-12-10 NOTE — Telephone Encounter (Signed)
Unable to reach via telephone.  [facility]

## 2014-12-26 ENCOUNTER — Telehealth (HOSPITAL_COMMUNITY): Payer: Self-pay

## 2014-12-26 NOTE — Telephone Encounter (Signed)
Unable to contact pt by mail or telephone. Unable to communicate lab results or treatment changes. 

## 2015-10-06 ENCOUNTER — Emergency Department (HOSPITAL_COMMUNITY): Payer: Medicaid Other

## 2015-10-06 ENCOUNTER — Emergency Department (HOSPITAL_COMMUNITY)
Admission: EM | Admit: 2015-10-06 | Discharge: 2015-10-06 | Disposition: A | Discharge status: Home / Self Care | Payer: Medicaid Other | Attending: Emergency Medicine | Admitting: Emergency Medicine

## 2015-10-06 DIAGNOSIS — Y939 Activity, unspecified: Secondary | ICD-10-CM | POA: Diagnosis not present

## 2015-10-06 DIAGNOSIS — T7411XA Adult physical abuse, confirmed, initial encounter: Secondary | ICD-10-CM | POA: Insufficient documentation

## 2015-10-06 DIAGNOSIS — Z79899 Other long term (current) drug therapy: Secondary | ICD-10-CM | POA: Diagnosis not present

## 2015-10-06 DIAGNOSIS — F17210 Nicotine dependence, cigarettes, uncomplicated: Secondary | ICD-10-CM | POA: Diagnosis not present

## 2015-10-06 DIAGNOSIS — Y999 Unspecified external cause status: Secondary | ICD-10-CM | POA: Insufficient documentation

## 2015-10-06 DIAGNOSIS — Z91040 Latex allergy status: Secondary | ICD-10-CM | POA: Insufficient documentation

## 2015-10-06 DIAGNOSIS — S0083XA Contusion of other part of head, initial encounter: Secondary | ICD-10-CM | POA: Diagnosis not present

## 2015-10-06 DIAGNOSIS — Y92009 Unspecified place in unspecified non-institutional (private) residence as the place of occurrence of the external cause: Secondary | ICD-10-CM | POA: Diagnosis not present

## 2015-10-06 DIAGNOSIS — T07XXXA Unspecified multiple injuries, initial encounter: Secondary | ICD-10-CM

## 2015-10-06 MED ORDER — IBUPROFEN 200 MG PO TABS
400.0000 mg | ORAL_TABLET | Freq: Once | ORAL | Status: AC
  Administered 2015-10-06: 400 mg via ORAL
  Filled 2015-10-06: qty 2

## 2015-10-06 MED ORDER — OXYCODONE-ACETAMINOPHEN 5-325 MG PO TABS
1.0000 | ORAL_TABLET | Freq: Once | ORAL | Status: AC
Start: 2015-10-06 — End: 2015-10-06
  Administered 2015-10-06: 1 via ORAL
  Filled 2015-10-06: qty 1

## 2015-10-06 NOTE — ED Notes (Signed)
Bed: [facility] Expected date:  Expected time:  Means of arrival:  Comments: 
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
## 2016-07-17 ENCOUNTER — Encounter (HOSPITAL_COMMUNITY): Payer: Self-pay | Admitting: Emergency Medicine

## 2016-07-17 ENCOUNTER — Ambulatory Visit (HOSPITAL_COMMUNITY)
Admission: EM | Admit: 2016-07-17 | Discharge: 2016-07-17 | Disposition: A | Discharge status: Home / Self Care | Payer: Medicaid Other | Attending: Family Medicine | Admitting: Family Medicine

## 2016-07-17 DIAGNOSIS — K098 Other cysts of oral region, not elsewhere classified: Secondary | ICD-10-CM

## 2016-07-17 MED ORDER — CLINDAMYCIN HCL 300 MG PO CAPS: 300.0000 mg | ORAL_CAPSULE | Freq: Three times a day (TID) | ORAL | 0 refills | Status: AC

## 2016-07-17 MED ORDER — IBUPROFEN 800 MG PO TABS: 800.0000 mg | ORAL_TABLET | Freq: Three times a day (TID) | ORAL | 0 refills | Status: AC

## 2016-07-17 NOTE — ED Provider Notes (Signed)
CSN: [id number]     Arrival date & time 07/17/16  1608 History   None    Chief Complaint  Patient presents with  . Oral Swelling  . Abdominal Pain   (Consider location/radiation/quality/duration/timing/severity/associated sxs/prior Treatment) 21 year old female presents with a chief complaint of oral swelling and abdominal pain that has been ongoing for several months. States she thinks she may have hernia. No fever, chills, N/V/D or constipation.    The history is provided by the patient.    Past Medical History:  Diagnosis Date  . Hx of varicella   . Medical history non-contributory    Past Surgical History:  Procedure Laterality Date  . MOUTH SURGERY    . oral cyst removal     History reviewed. No pertinent family history. Social History  Substance Use Topics  . Smoking status: Current Every Day Smoker    Packs/day: 0.50    Types: Cigarettes  . Smokeless tobacco: Never Used  . Alcohol use No   OB History    Gravida Para Term Preterm AB Living   2 1 1  0 1 1   SAB TAB Ectopic Multiple Live Births   1 0 0 0 1     Review of Systems  Constitutional: Negative for appetite change, chills, fatigue and fever.  HENT: Positive for dental problem. Negative for sinus pressure, sore throat and tinnitus.   Respiratory: Negative.   Cardiovascular: Negative.   Gastrointestinal: Positive for abdominal pain. Negative for constipation and nausea.  Musculoskeletal: Negative.   Skin: Negative.   Neurological: Negative.     Allergies  Tylenol [acetaminophen]; Amoxicillin; Penicillins; and Latex  Home Medications   Prior to Admission medications   Medication Sig Start Date End Date Taking? Authorizing Provider  clindamycin (CLEOCIN) 300 MG capsule Take 1 capsule (300 mg total) by mouth 3 (three) times daily. 07/17/16   Dorena Bodo, NP  ibuprofen (ADVIL,MOTRIN) 800 MG tablet Take 1 tablet (800 mg total) by mouth 3 (three) times daily. 07/17/16   Dorena Bodo, NP    Meds Ordered and Administered this Visit  Medications - No data to display  BP 116/71 (BP Location: Left Arm)   Pulse 66   Temp 97.8 F (36.6 C) (Oral)   Resp 14   SpO2 99%  No data found.   Physical Exam  Constitutional: She is oriented to person, place, and time. She appears well-developed and well-nourished. No distress.  HENT:  Head: Normocephalic and atraumatic.  Right Ear: External ear normal.  Left Ear: External ear normal.  Mouth/Throat:    Eyes: Conjunctivae are normal.  Neck: Normal range of motion.  Cardiovascular: Normal rate and regular rhythm.   Pulmonary/Chest: Effort normal and breath sounds normal.  Abdominal: Soft. Bowel sounds are normal. There is no tenderness.  Neurological: She is alert and oriented to person, place, and time.  Skin: Skin is warm and dry. Capillary refill takes less than 2 seconds. No rash noted. She is not diaphoretic. No erythema.  Psychiatric: She has a normal mood and affect. Her behavior is normal.  Nursing note and vitals reviewed.   Urgent Care Course     Procedures (including critical care time)  Labs Review Labs Reviewed - No data to display  Imaging Review No results found.     MDM   1. Oral cyst    Unable to feel or appreciate hernia, follow up with with a primary care provider for further evaluation.  For oral cyst follow up with dentistry  or oral surgeon.     Dorena BodoKennard, Kaled Allende, NP 07/17/16 1719

## 2016-07-17 NOTE — Discharge Instructions (Signed)
Schedule an appointment with a dentist or oral surgeon to have your cyst removed. I have attached the contact information for [facility], contact them to schedule an appointment to establish for primary care.

## 2016-07-17 NOTE — ED Triage Notes (Signed)
[facility].  The patient complained of an oral abscess.  The patient also complained of pain and tightness around her umbilicus x 3 days. She stated "it feels like a knot."

## 2017-04-30 IMAGING — US US PELVIS COMPLETE
1 series · 13 of 25 positions shown · non-contrast
Comparison: CT abdomen and pelvis [DATE]

CLINICAL DATA: Right lower quadrant pain for 2 days.

EXAM:
TRANSABDOMINAL AND TRANSVAGINAL ULTRASOUND OF PELVIS
DOPPLER ULTRASOUND OF OVARIES
TECHNIQUE: Both transabdominal and transvaginal ultrasound examinations of the
pelvis were performed. Transabdominal technique was performed for
global imaging of the pelvis including uterus, ovaries, adnexal
regions, and pelvic cul-de-sac.
It was necessary to proceed with endovaginal exam following the
transabdominal exam to visualize the uterus and ovaries. Color and
duplex Doppler ultrasound was utilized to evaluate blood flow to the
ovaries.

[Series 1: us pelvis complete · 0.18mm/px · 73 acquisitions, 13 frames shown]
[im 1/73]
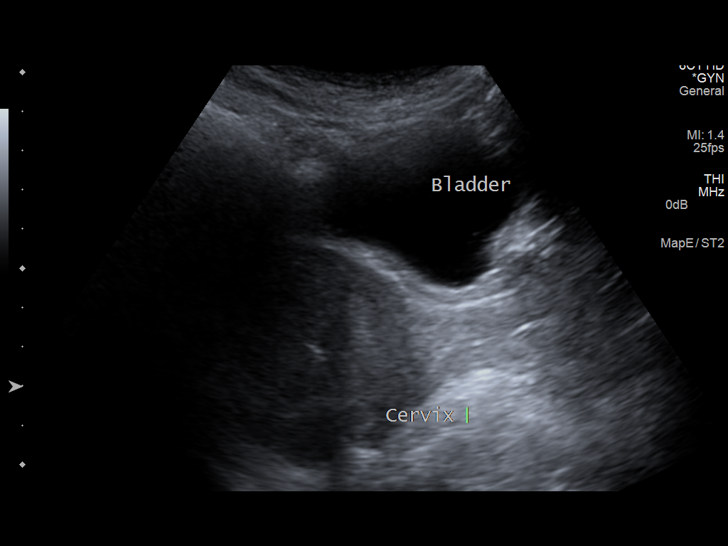
[im 7/73]
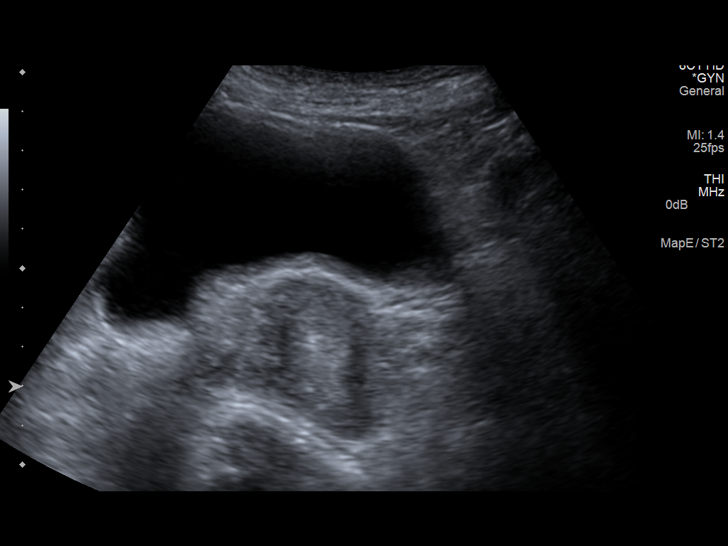
[im 13/73]
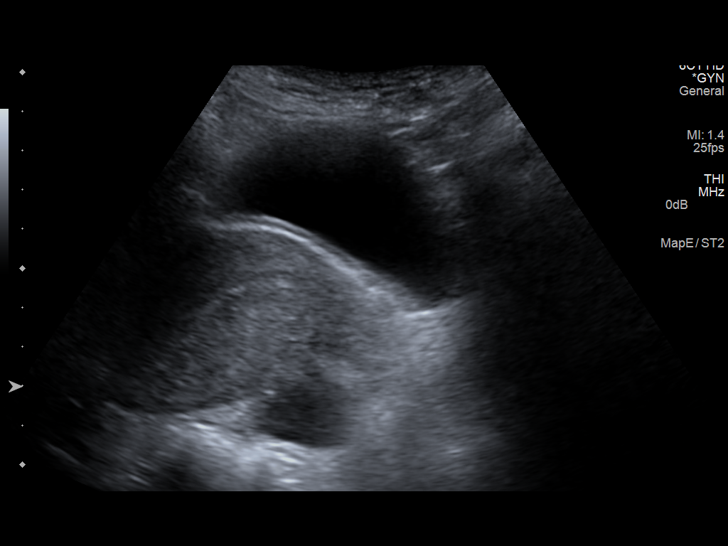
[im 19/73]
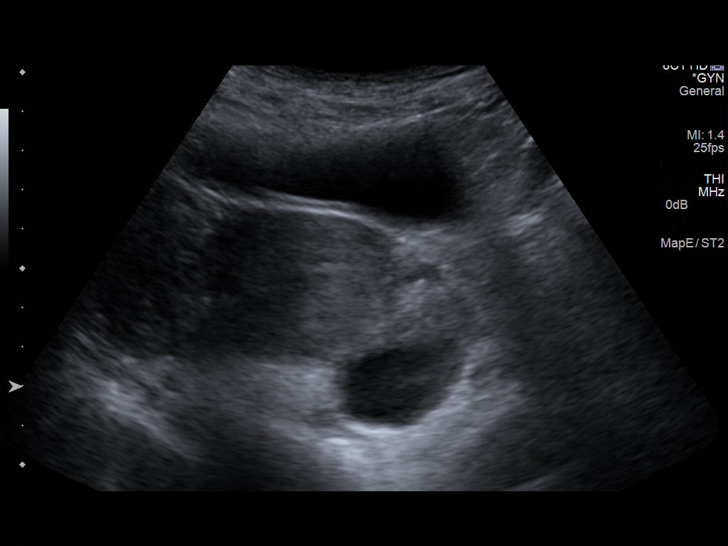
[im 25/73]
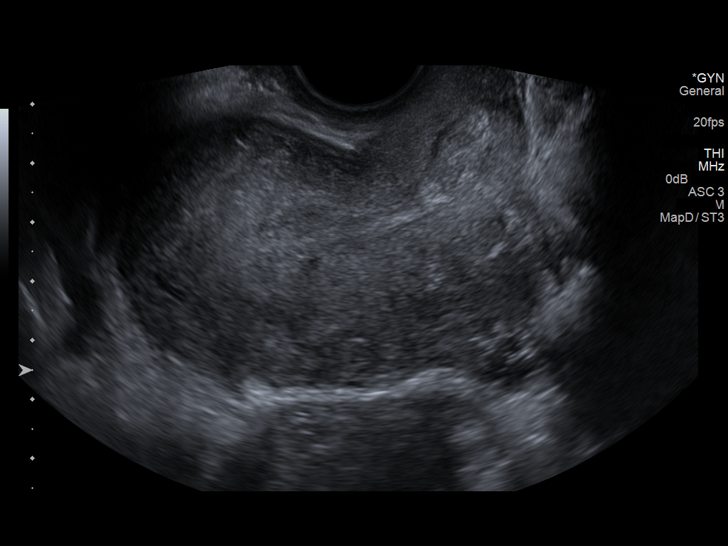
[im 31/73]
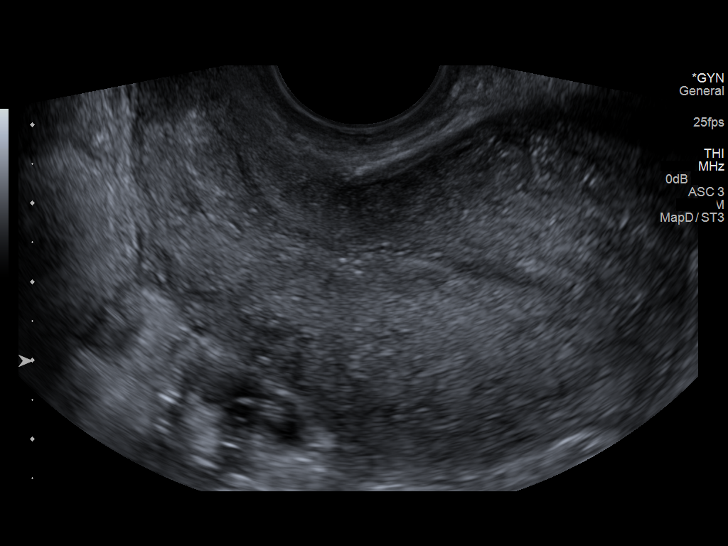
[im 37/73]
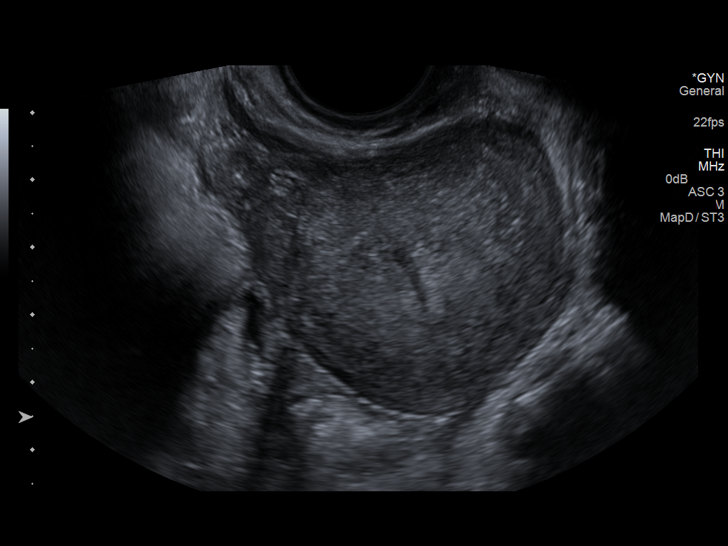
[im 43/73]
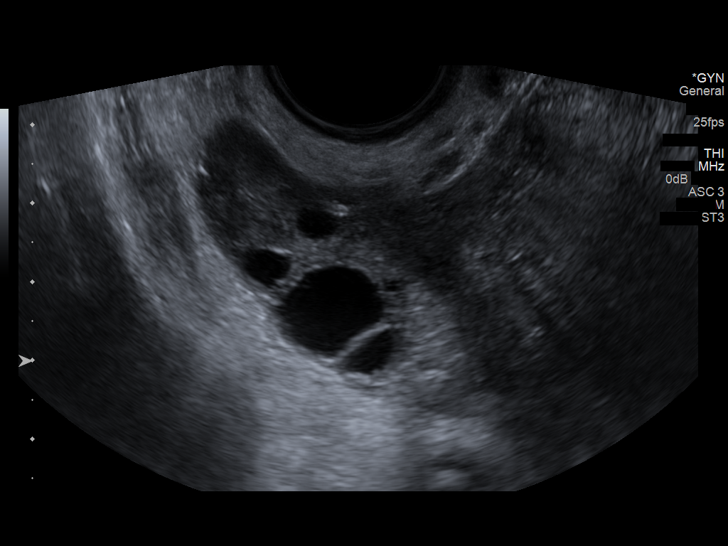
[im 49/73]
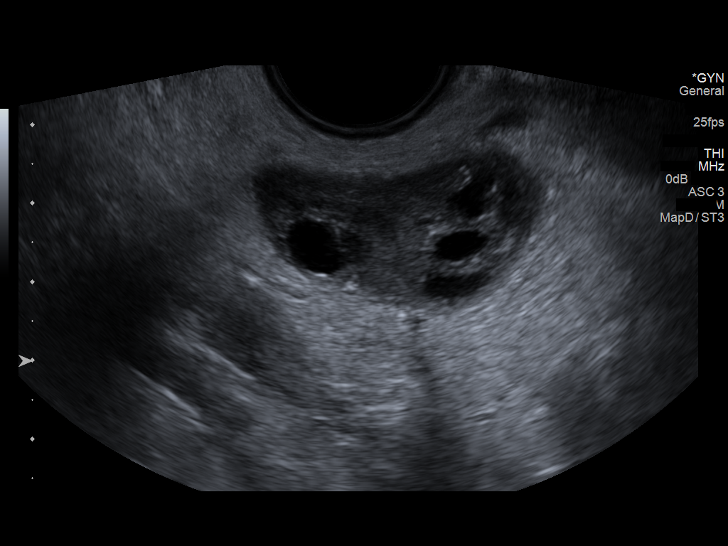
[im 55/73]
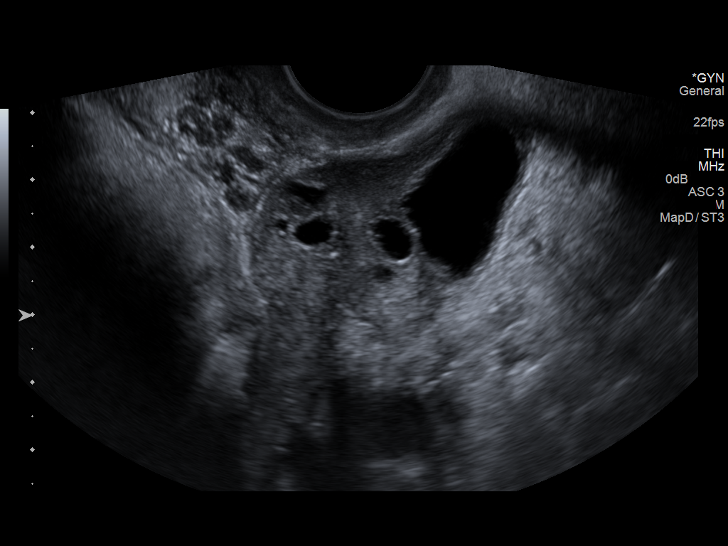
[im 61/73]
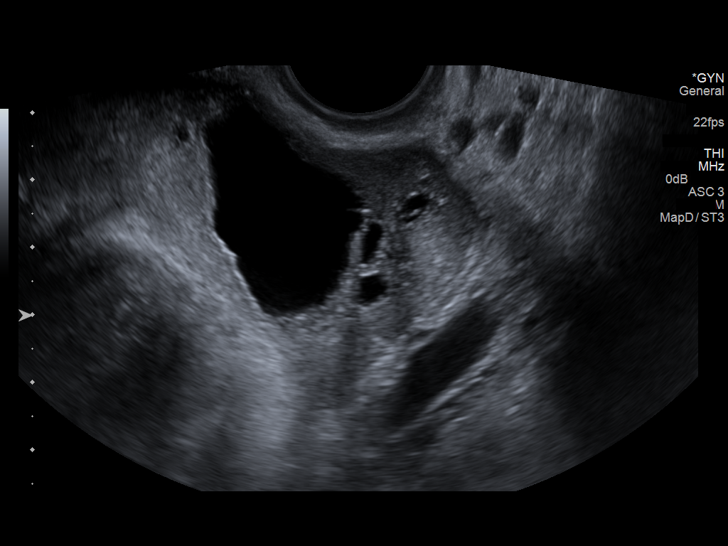
[im 67/73]
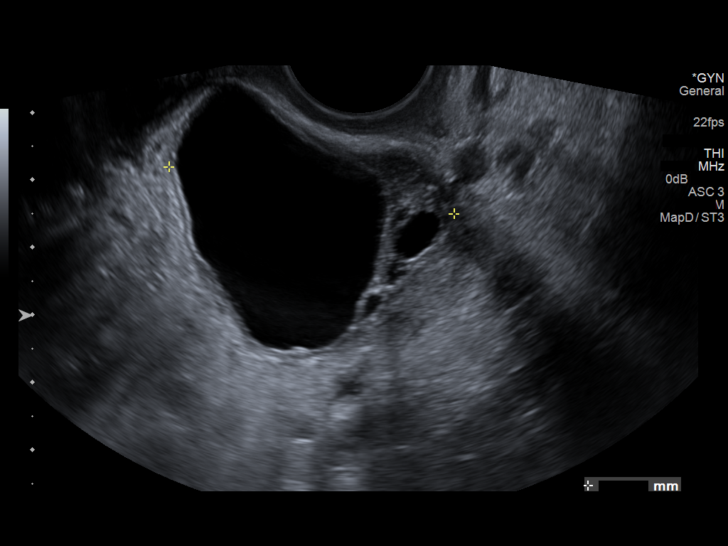
[im 73/73]
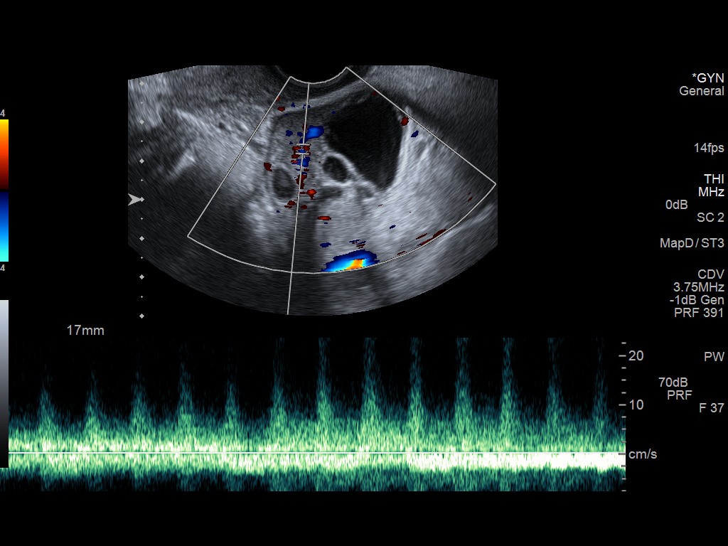

[13 of 25 positions shown; findings below may reference images not displayed]

FINDINGS: Uterus

Measurements: 8.3 x 4.8 x 5.9 cm. Uterus is retroflexed. No fibroids
or other mass visualized.

Endometrium

Thickness: 8.5 mm.  No focal abnormality visualized.

Right ovary

Measurements: 4.1 x 1.8 x 3.5 cm. Normal appearance/no adnexal mass.

Left ovary

Measurements: 5 x 3.3 x 4.3 cm. Uncomplicated cyst measuring 3.9 x
2.8 x 2.7 cm corresponding to changes on CT. This is likely
functional and a reproductive age patient and no follow-up is
needed.

Pulsed Doppler evaluation of both ovaries demonstrates normal
low-resistance arterial and venous waveforms. Flow is demonstrated
in both ovaries on color flow Doppler imaging.

Other findings

No free fluid.
IMPRESSION: Benign-appearing cysts in the left ovary with maximal diameter of
3.9 cm. No follow-up necessary in a reproductive age patient. Uterus
and right ovary are unremarkable. No evidence of ovarian torsion.

## 2017-05-09 IMAGING — US US TRANSVAGINAL NON-OB
1 series · 13 of 25 positions shown · non-contrast
Comparison: 11/28/2014

CLINICAL DATA: Lower abdominal pain, right greater than left. Eight
weeks postpartum.



[Series 1: us transvaginal non-ob · 0.20mm/px · 69 acquisitions, 13 frames shown]
[im 1/69]
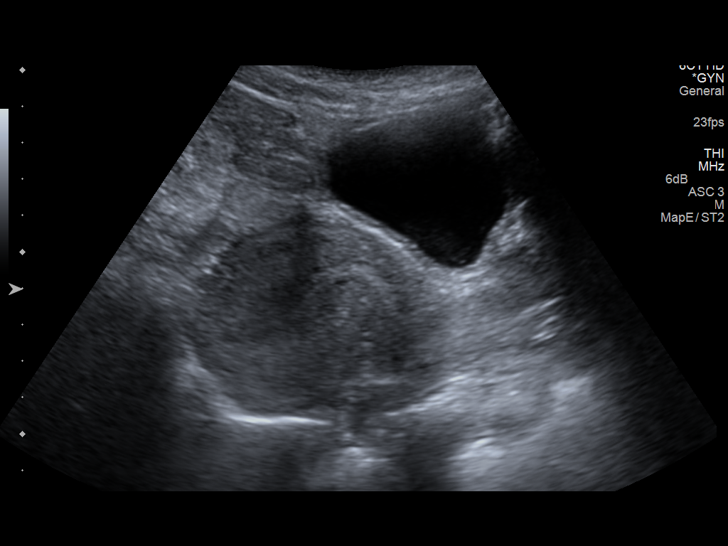
[im 6/69]
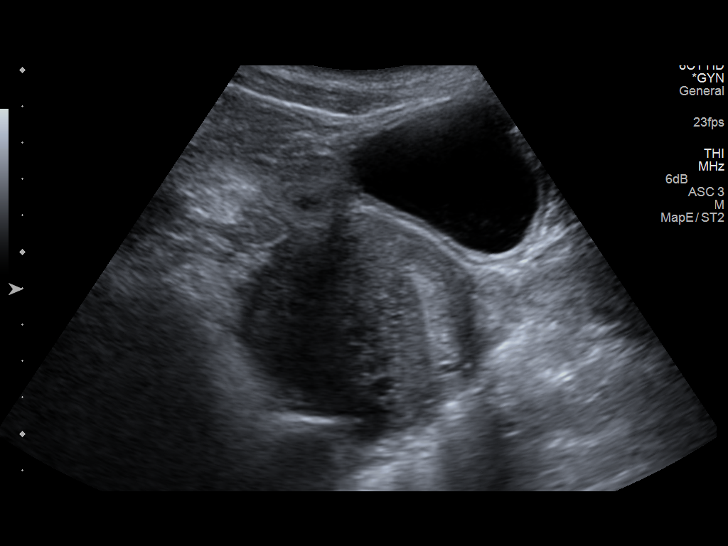
[im 12/69]
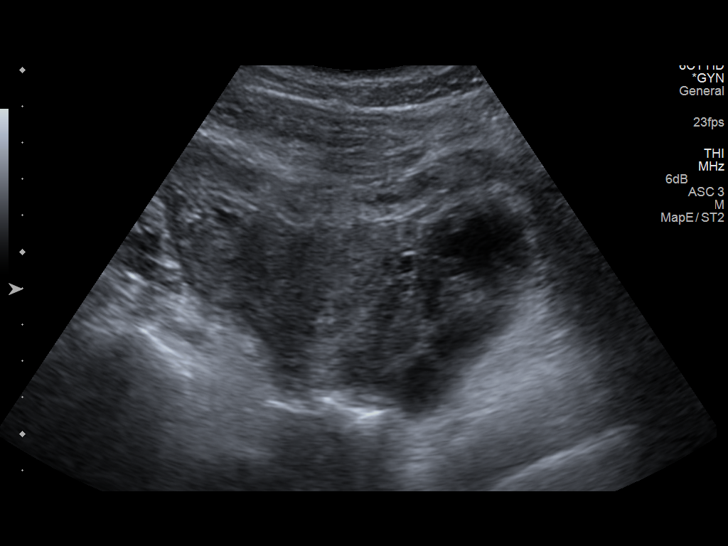
[im 18/69]
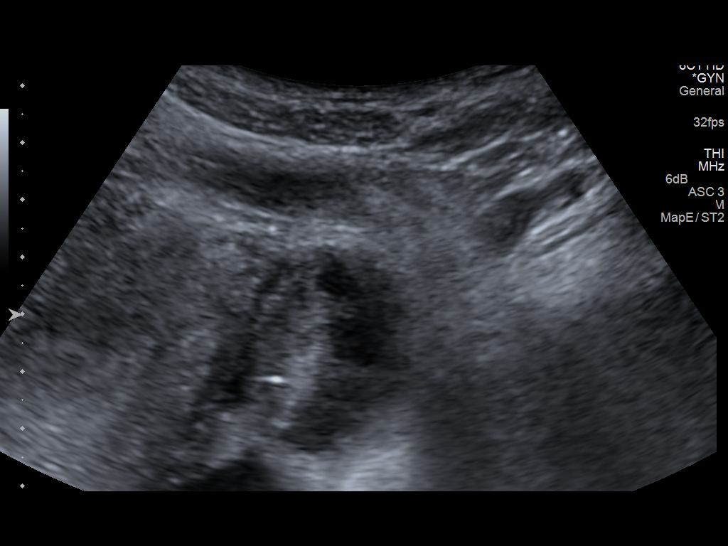
[im 23/69]
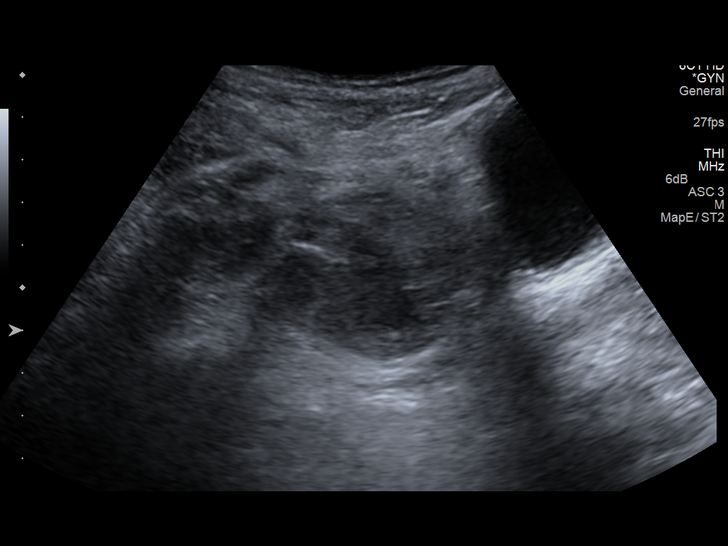
[im 29/69]
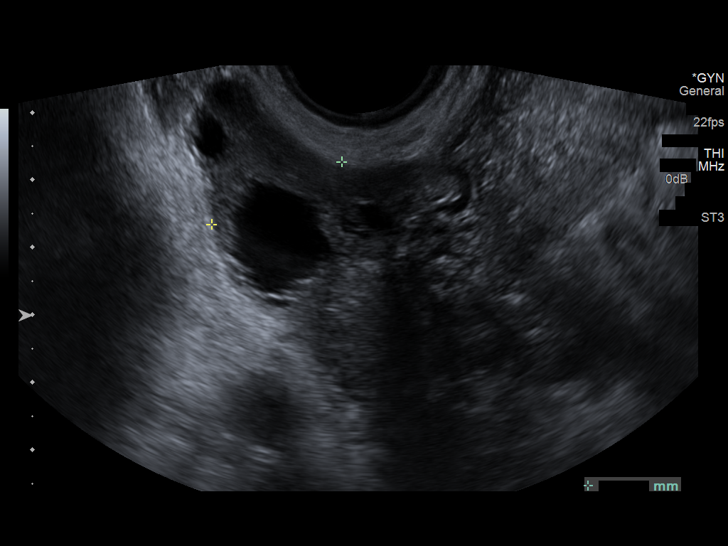
[im 35/69]
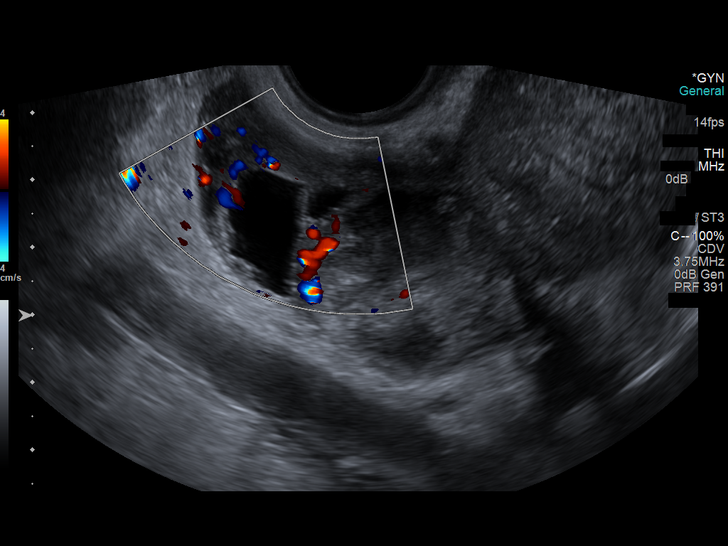
[im 40/69]
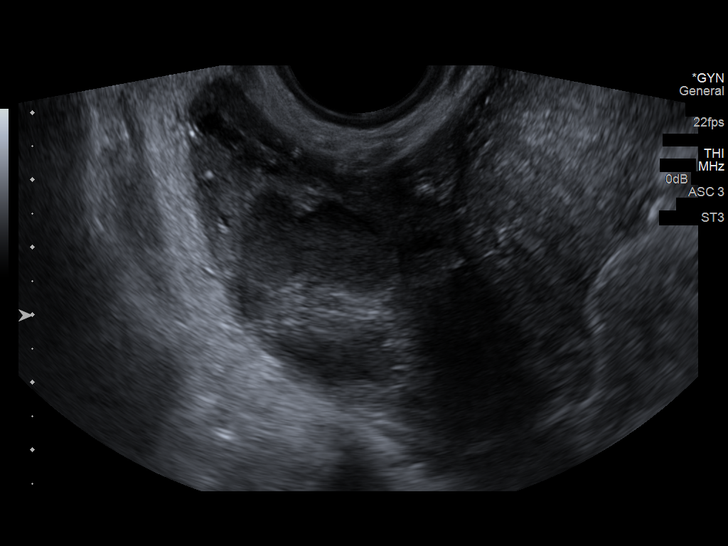
[im 46/69]
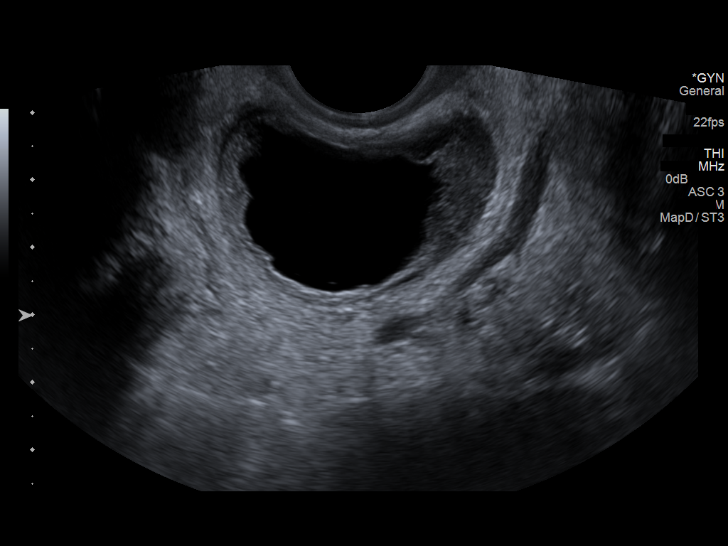
[im 52/69]
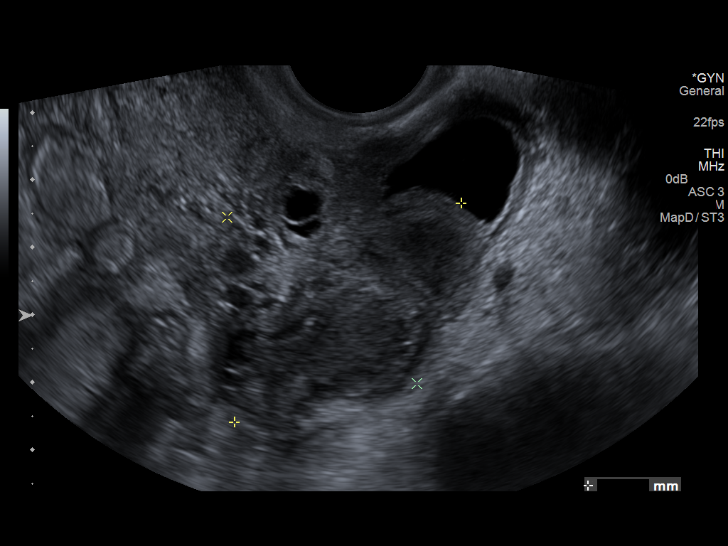
[im 57/69]
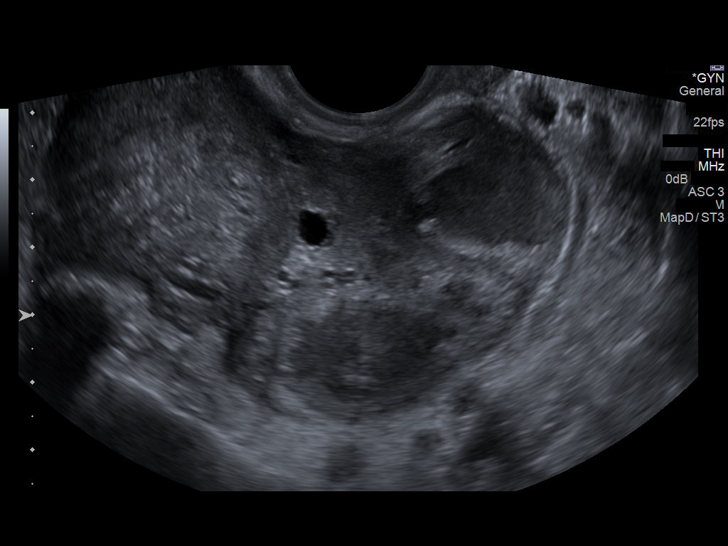
[im 63/69]
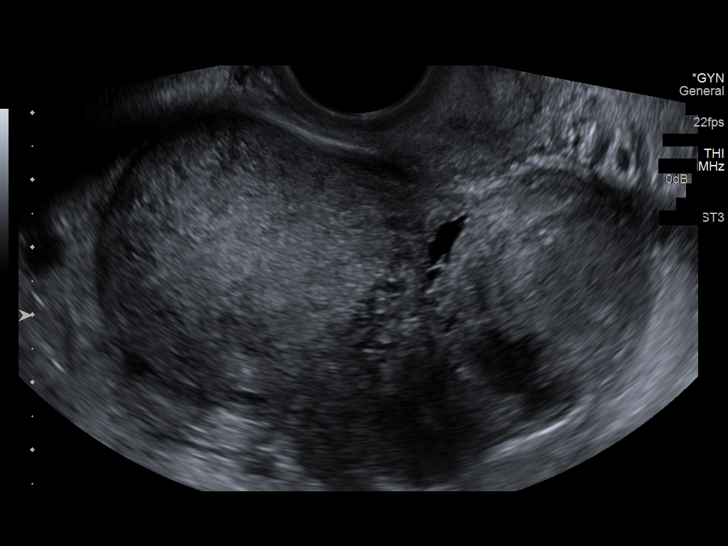
[im 69/69]
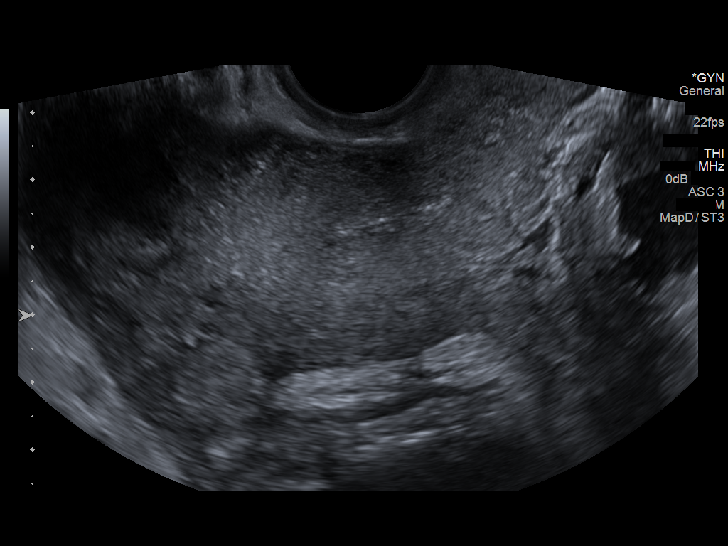

[13 of 25 positions shown; findings below may reference images not displayed]

FINDINGS: Uterus

Measurements: 10.3 x 5.8 x 4.8 cm. No fibroids or other mass
visualized.

Endometrium

Thickness: 4 mm.  No focal abnormality visualized.

Right ovary

Measurements: 3.3 x 3.1 x 2.1 cm. There is a partially collapsed
cm cyst or follicle, likely physiologic.

Left ovary

Measurements: 2.9 x 3.4 x 4.2 cm. There is a 2.5 cm uncomplicated
cyst which has decreased in size, measuring 3.9 cm on 11/28/2014. No
solid or complex adnexal lesions.

Other findings

No free pelvic fluid.
IMPRESSION: Uncomplicated left ovarian cyst which is decreased in size since
11/28/2014. Partially collapsed right ovarian cyst is probably
physiologic. Normal uterus.
